# Patient Record
Sex: Female | Born: 1950
Health system: Southern US, Community
[De-identification: ages and names within clinical notes are randomized; demographics above are authoritative.]

## PROBLEM LIST (undated history)

## (undated) DIAGNOSIS — I10 Essential (primary) hypertension: Secondary | ICD-10-CM

## (undated) DIAGNOSIS — I6529 Occlusion and stenosis of unspecified carotid artery: Secondary | ICD-10-CM

## (undated) DIAGNOSIS — R112 Nausea with vomiting, unspecified: Secondary | ICD-10-CM

## (undated) DIAGNOSIS — Z9889 Other specified postprocedural states: Secondary | ICD-10-CM

## (undated) DIAGNOSIS — M1990 Unspecified osteoarthritis, unspecified site: Secondary | ICD-10-CM

## (undated) HISTORY — PX: DENTAL SURGERY: SHX609

## (undated) HISTORY — PX: EYE SURGERY: SHX253

## (undated) HISTORY — PX: CHOLECYSTECTOMY: SHX55

## (undated) HISTORY — PX: DILATION AND CURETTAGE OF UTERUS: SHX78

## (undated) HISTORY — PX: OTHER SURGICAL HISTORY: SHX169

## (undated) HISTORY — DX: Occlusion and stenosis of unspecified carotid artery: I65.29

---

## 1999-09-29 ENCOUNTER — Encounter: Payer: Self-pay | Admitting: *Deleted

## 1999-09-29 ENCOUNTER — Encounter: Admission: RE | Admit: 1999-09-29 | Discharge: 1999-09-29 | Payer: Self-pay | Admitting: *Deleted

## 1999-10-06 ENCOUNTER — Encounter: Payer: Self-pay | Admitting: *Deleted

## 1999-10-06 ENCOUNTER — Encounter: Admission: RE | Admit: 1999-10-06 | Discharge: 1999-10-06 | Payer: Self-pay | Admitting: *Deleted

## 2000-10-31 ENCOUNTER — Encounter: Payer: Self-pay | Admitting: *Deleted

## 2000-10-31 ENCOUNTER — Encounter: Admission: RE | Admit: 2000-10-31 | Discharge: 2000-10-31 | Payer: Self-pay | Admitting: Family Medicine

## 2001-12-26 ENCOUNTER — Encounter: Admission: RE | Admit: 2001-12-26 | Discharge: 2001-12-26 | Payer: Self-pay | Admitting: Family Medicine

## 2001-12-26 ENCOUNTER — Encounter: Payer: Self-pay | Admitting: *Deleted

## 2003-06-15 ENCOUNTER — Encounter: Payer: Self-pay | Admitting: *Deleted

## 2003-06-15 ENCOUNTER — Encounter: Admission: RE | Admit: 2003-06-15 | Discharge: 2003-06-15 | Payer: Self-pay | Admitting: Family Medicine

## 2004-08-10 ENCOUNTER — Encounter: Admission: RE | Admit: 2004-08-10 | Discharge: 2004-08-10 | Payer: Self-pay | Admitting: Family Medicine

## 2004-08-19 ENCOUNTER — Encounter: Admission: RE | Admit: 2004-08-19 | Discharge: 2004-08-19 | Payer: Self-pay | Admitting: Family Medicine

## 2005-10-17 ENCOUNTER — Encounter: Admission: RE | Admit: 2005-10-17 | Discharge: 2005-10-17 | Payer: Self-pay | Admitting: Family Medicine

## 2007-01-22 ENCOUNTER — Encounter: Admission: RE | Admit: 2007-01-22 | Discharge: 2007-01-22 | Payer: Self-pay | Admitting: Family Medicine

## 2010-10-01 ENCOUNTER — Encounter: Payer: Self-pay | Admitting: Family Medicine

## 2011-08-10 ENCOUNTER — Encounter (HOSPITAL_COMMUNITY): Payer: Self-pay

## 2011-08-14 ENCOUNTER — Encounter (HOSPITAL_COMMUNITY)
Admission: RE | Admit: 2011-08-14 | Discharge: 2011-08-14 | Disposition: A | Discharge status: Home / Self Care | Payer: Managed Care, Other (non HMO) | Source: Ambulatory Visit | Attending: Orthopedic Surgery | Admitting: Orthopedic Surgery

## 2011-08-14 ENCOUNTER — Encounter (HOSPITAL_COMMUNITY): Payer: Self-pay

## 2011-08-14 HISTORY — DX: Essential (primary) hypertension: I10

## 2011-08-14 HISTORY — DX: Nausea with vomiting, unspecified: R11.2

## 2011-08-14 HISTORY — DX: Other specified postprocedural states: Z98.890

## 2011-08-14 HISTORY — DX: Unspecified osteoarthritis, unspecified site: M19.90

## 2011-08-14 LAB — URINALYSIS, ROUTINE W REFLEX MICROSCOPIC
Leukocytes, UA: NEGATIVE
Protein, ur: NEGATIVE mg/dL
Urobilinogen, UA: 0.2 mg/dL (ref 0.0–1.0)

## 2011-08-14 LAB — APTT: aPTT: 31 seconds (ref 24–37)

## 2011-08-14 LAB — TYPE AND SCREEN: ABO/RH(D): O POS

## 2011-08-14 LAB — SURGICAL PCR SCREEN: MRSA, PCR: NEGATIVE

## 2011-08-14 LAB — PROTIME-INR: Prothrombin Time: 13.2 seconds (ref 11.6–15.2)

## 2011-08-14 MED ORDER — TRANEXAMIC ACID 100 MG/ML IV SOLN
1530.0000 mg | Freq: Once | INTRAVENOUS | Status: AC
  Administered 2011-08-15: 1530 mg via INTRAVENOUS
  Filled 2011-08-14: qty 15.3

## 2011-08-14 NOTE — H&P (Signed)
  Kristin Byrd is an 60 y.o. female.    Chief Complaint: right hip OA and pain   HPI: Pt is a 60 y.o. female complaining of right hip pain for a couple of years. Pain had continually increased since the beginning. X-rays in the clinic show end-stage arthritic changes of the right hip. Pt has tried various conservative treatments which have failed to alleviate their symptoms. Various options are discussed with the patient. Risks, benefits and expectations were discussed with the patient. Patient understand the risks, benefits and expectations and wishes to proceed with surgery.   PCP:  Hadley Pen, MD  D/C Plans: Home with HHPT  Post-op Meds: Rx given for ASA, Robaxin, Celebrex, Iron, Colace and MiraLax  Tranexamic Acid: To be given  PMH: Diabetes - non-insulin HTN Seasonal Allergies  PSH: Cholecystectomy Excision of wrist cyst  Social History:  does not have a smoking history on file. She does not have any smokeless tobacco history on file. Her alcohol and drug histories not on file.  Allergies:  Allergies  Allergen Reactions  . Hydrocodone Itching  . Oxycodone Itching  . Shellfish Allergy Nausea And Vomiting    Medications: 1. Metformin 500 mg 1 PO bid 2. Lotrel 5/20 mg 1 PO bid 3. Montelukast 10 mg 1 PO daily 4. Zyrtec 10 mg 1 PO daily   ROS: Review of Systems  Constitutional: Negative.   HENT: Negative.   Respiratory: Negative.   Cardiovascular: Negative.   Gastrointestinal: Negative.   Genitourinary: Negative.   Musculoskeletal: Positive for joint pain.  Skin: Negative.   Neurological: Negative.   Endo/Heme/Allergies: Positive for environmental allergies.  Psychiatric/Behavioral: Negative.      Physican Exam: Physical Exam  Constitutional: She is oriented to person, place, and time and well-developed, well-nourished, and in no distress. No distress.  HENT:  Head: Normocephalic and atraumatic.  Eyes: Conjunctivae and EOM are normal. Pupils  are equal, round, and reactive to light.  Neck: Neck supple. No JVD present. No tracheal deviation present. No thyromegaly present.  Cardiovascular: Normal rate, regular rhythm and normal heart sounds.  Exam reveals no gallop and no friction rub.   No murmur heard. Pulmonary/Chest: Effort normal and breath sounds normal. No stridor. No respiratory distress. She has no wheezes. She has no rales. She exhibits no tenderness.  Abdominal: Soft. There is no tenderness. There is no guarding.  Musculoskeletal:       Right knee: She exhibits decreased range of motion and swelling. She exhibits no ecchymosis and no erythema. tenderness found.  Lymphadenopathy:    She has no cervical adenopathy.  Neurological: She is alert and oriented to person, place, and time.  Skin: Skin is warm and dry. No rash noted. She is not diaphoretic. No erythema. No pallor.  Psychiatric: Affect normal.    Assessment/Plan Assessment: right Hip OA and pain   Plan: Patient will undergo a right total hip arthroplasty, anterior approach on 08/15/2011. Risks benefits and expectation were discussed with the patient. Patient understand risks, benefits and expectation and wishes to proceed.   Anastasio Auerbach Kristin Byrd   PAC  08/14/2011, 10:14 AM

## 2011-08-14 NOTE — Patient Instructions (Signed)
[name]  08/14/2011   Your procedure is scheduled on:  08/15/11  Report to [facility] at 12:00 PM  Call this number if you have problems the morning of surgery: [phone]   Remember:   Do not eat food:AFTER MIDNIGHT  May have clear liquids:UNTIL 8:00AM  Clear liquids include soda, tea, black coffee, apple or grape juice, broth.  Take these medicines the morning of surgery with A SIP OF WATER: SINGULAIR / NORCO OR PERCOCET IF NEEDED MAY USE NASONEX   Do not wear jewelry, make-up or nail polish.  Do not wear lotions, powders, or perfumes. You may wear deodorant.  Do not shave 48 hours prior to surgery.  Do not bring valuables to the hospital.  Contacts, dentures or bridgework may not be worn into surgery.  Leave suitcase in the car. After surgery it may be brought to your room.  For patients admitted to the hospital, checkout time is 11:00 AM the day of discharge.   Patients discharged the day of surgery will not be allowed to drive home.  Name and phone number of your driver:   Special Instructions: CHG Shower Use Special Wash: 1/2 bottle night before surgery and 1/2 bottle morning of surgery.   Please read over the following fact sheets that you were given: MRSA Information

## 2011-08-15 ENCOUNTER — Encounter (HOSPITAL_COMMUNITY): Payer: Self-pay | Admitting: *Deleted

## 2011-08-15 ENCOUNTER — Inpatient Hospital Stay (HOSPITAL_COMMUNITY): Payer: Managed Care, Other (non HMO)

## 2011-08-15 ENCOUNTER — Encounter (HOSPITAL_COMMUNITY): Payer: Self-pay | Admitting: Anesthesiology

## 2011-08-15 ENCOUNTER — Inpatient Hospital Stay (HOSPITAL_COMMUNITY)
Admission: RE | Admit: 2011-08-15 | Discharge: 2011-08-17 | DRG: 470 | Disposition: A | Discharge status: Home Health | Payer: Managed Care, Other (non HMO) | Source: Ambulatory Visit | Attending: Orthopedic Surgery | Admitting: Orthopedic Surgery

## 2011-08-15 ENCOUNTER — Encounter (HOSPITAL_COMMUNITY)
Admission: RE | Disposition: A | Discharge status: Home Health | Payer: Self-pay | Source: Ambulatory Visit | Attending: Orthopedic Surgery

## 2011-08-15 ENCOUNTER — Inpatient Hospital Stay (HOSPITAL_COMMUNITY): Payer: Managed Care, Other (non HMO) | Admitting: Anesthesiology

## 2011-08-15 DIAGNOSIS — E119 Type 2 diabetes mellitus without complications: Secondary | ICD-10-CM | POA: Diagnosis present

## 2011-08-15 DIAGNOSIS — Z96649 Presence of unspecified artificial hip joint: Secondary | ICD-10-CM

## 2011-08-15 DIAGNOSIS — Z01812 Encounter for preprocedural laboratory examination: Secondary | ICD-10-CM

## 2011-08-15 DIAGNOSIS — I10 Essential (primary) hypertension: Secondary | ICD-10-CM | POA: Diagnosis present

## 2011-08-15 DIAGNOSIS — M169 Osteoarthritis of hip, unspecified: Principal | ICD-10-CM | POA: Diagnosis present

## 2011-08-15 DIAGNOSIS — M1610 Unilateral primary osteoarthritis, unspecified hip: Principal | ICD-10-CM | POA: Diagnosis present

## 2011-08-15 HISTORY — PX: TOTAL HIP ARTHROPLASTY: SHX124

## 2011-08-15 LAB — GLUCOSE, CAPILLARY
Glucose-Capillary: 95 mg/dL (ref 70–99)
Glucose-Capillary: 145 mg/dL — ABNORMAL HIGH (ref 70–99)
Glucose-Capillary: 190 mg/dL — ABNORMAL HIGH (ref 70–99)

## 2011-08-15 SURGERY — ARTHROPLASTY, HIP, TOTAL, ANTERIOR APPROACH
Anesthesia: General | Site: Hip | Laterality: Right | Wound class: Clean

## 2011-08-15 MED ORDER — VITAMIN D 50 MCG (2000 UT) PO TABS: 2000.0000 [IU] | ORAL_TABLET | ORAL | Status: DC

## 2011-08-15 MED ORDER — OMEGA-3-ACID ETHYL ESTERS 1 G PO CAPS
1.0000 g | ORAL_CAPSULE | Freq: Two times a day (BID) | ORAL | Status: DC
  Administered 2011-08-16 (×2): 1 g via ORAL
  Filled 2011-08-15 (×6): qty 1

## 2011-08-15 MED ORDER — GLYCOPYRROLATE 0.2 MG/ML IJ SOLN
INTRAMUSCULAR | Status: DC | PRN
  Administered 2011-08-15: .4 mg via INTRAVENOUS

## 2011-08-15 MED ORDER — METOCLOPRAMIDE HCL 5 MG/ML IJ SOLN: 5.0000 mg | Freq: Three times a day (TID) | INTRAMUSCULAR | Status: DC | PRN

## 2011-08-15 MED ORDER — FLUTICASONE PROPIONATE 50 MCG/ACT NA SUSP
1.0000 | Freq: Every day | NASAL | Status: DC
  Administered 2011-08-16: 1 via NASAL
  Filled 2011-08-15: qty 16

## 2011-08-15 MED ORDER — CEFAZOLIN SODIUM-DEXTROSE 2-3 GM-% IV SOLR
INTRAVENOUS | Status: AC
  Filled 2011-08-15: qty 50

## 2011-08-15 MED ORDER — PHENOL 1.4 % MT LIQD
1.0000 | OROMUCOSAL | Status: DC | PRN
  Filled 2011-08-15: qty 177

## 2011-08-15 MED ORDER — ALUM & MAG HYDROXIDE-SIMETH 200-200-20 MG/5ML PO SUSP: 30.0000 mL | ORAL | Status: DC | PRN

## 2011-08-15 MED ORDER — AMLODIPINE BESY-BENAZEPRIL HCL 5-20 MG PO CAPS: 1.0000 | ORAL_CAPSULE | Freq: Two times a day (BID) | ORAL | Status: DC

## 2011-08-15 MED ORDER — HYDROMORPHONE HCL PF 1 MG/ML IJ SOLN: 0.5000 mg | INTRAMUSCULAR | Status: DC | PRN

## 2011-08-15 MED ORDER — ROCURONIUM BROMIDE 100 MG/10ML IV SOLN
INTRAVENOUS | Status: DC | PRN
  Administered 2011-08-15: 50 mg via INTRAVENOUS
  Administered 2011-08-15: 20 mg via INTRAVENOUS

## 2011-08-15 MED ORDER — ESMOLOL HCL 10 MG/ML IV SOLN
INTRAVENOUS | Status: DC | PRN
  Administered 2011-08-15: 20 mg via INTRAVENOUS

## 2011-08-15 MED ORDER — SODIUM CHLORIDE 0.9 % IR SOLN
Status: DC | PRN
  Administered 2011-08-15: 1000 mL

## 2011-08-15 MED ORDER — POLYETHYLENE GLYCOL 3350 17 G PO PACK
17.0000 g | PACK | Freq: Two times a day (BID) | ORAL | Status: DC
  Administered 2011-08-16: 17 g via ORAL
  Filled 2011-08-15 (×6): qty 1

## 2011-08-15 MED ORDER — AMLODIPINE BESYLATE 5 MG PO TABS
5.0000 mg | ORAL_TABLET | Freq: Two times a day (BID) | ORAL | Status: DC
  Administered 2011-08-15 – 2011-08-17 (×4): 5 mg via ORAL
  Filled 2011-08-15 (×6): qty 1

## 2011-08-15 MED ORDER — DEXAMETHASONE SODIUM PHOSPHATE 10 MG/ML IJ SOLN
INTRAMUSCULAR | Status: DC | PRN
  Administered 2011-08-15: 10 mg via INTRAVENOUS

## 2011-08-15 MED ORDER — CARBOXYMETHYLCELLULOSE SODIUM 1 % OP SOLN: 1.0000 [drp] | Freq: Every day | OPHTHALMIC | Status: DC

## 2011-08-15 MED ORDER — MIDAZOLAM HCL 5 MG/5ML IJ SOLN
INTRAMUSCULAR | Status: DC | PRN
  Administered 2011-08-15: 2 mg via INTRAVENOUS

## 2011-08-15 MED ORDER — MONTELUKAST SODIUM 10 MG PO TABS
10.0000 mg | ORAL_TABLET | ORAL | Status: DC
Start: 2011-08-16 — End: 2011-08-17
  Administered 2011-08-16: 10 mg via ORAL
  Filled 2011-08-15 (×3): qty 1

## 2011-08-15 MED ORDER — HYDROCODONE-ACETAMINOPHEN 7.5-325 MG PO TABS
1.0000 | ORAL_TABLET | ORAL | Status: DC | PRN
  Administered 2011-08-15: 1 via ORAL
  Administered 2011-08-16: 2 via ORAL
  Administered 2011-08-16 (×2): 1 via ORAL
  Administered 2011-08-16 – 2011-08-17 (×4): 2 via ORAL
  Filled 2011-08-15 (×3): qty 2
  Filled 2011-08-15 (×2): qty 1
  Filled 2011-08-15: qty 2
  Filled 2011-08-15 (×2): qty 1
  Filled 2011-08-15: qty 2
  Filled 2011-08-15: qty 1

## 2011-08-15 MED ORDER — ACETAMINOPHEN 10 MG/ML IV SOLN
INTRAVENOUS | Status: DC | PRN
  Administered 2011-08-15: 1000 mg via INTRAVENOUS

## 2011-08-15 MED ORDER — METOCLOPRAMIDE HCL 10 MG PO TABS
5.0000 mg | ORAL_TABLET | Freq: Three times a day (TID) | ORAL | Status: DC | PRN
  Filled 2011-08-15: qty 1

## 2011-08-15 MED ORDER — INSULIN ASPART 100 UNIT/ML ~~LOC~~ SOLN
0.0000 [IU] | Freq: Three times a day (TID) | SUBCUTANEOUS | Status: DC
  Administered 2011-08-16: 2 [IU] via SUBCUTANEOUS
  Administered 2011-08-16: 3 [IU] via SUBCUTANEOUS
  Administered 2011-08-16: 2 [IU] via SUBCUTANEOUS
  Filled 2011-08-15 (×2): qty 3

## 2011-08-15 MED ORDER — ONDANSETRON HCL 4 MG PO TABS: 4.0000 mg | ORAL_TABLET | Freq: Four times a day (QID) | ORAL | Status: DC | PRN

## 2011-08-15 MED ORDER — BENAZEPRIL HCL 20 MG PO TABS
20.0000 mg | ORAL_TABLET | Freq: Two times a day (BID) | ORAL | Status: DC
  Administered 2011-08-15 – 2011-08-17 (×4): 20 mg via ORAL
  Filled 2011-08-15 (×6): qty 1

## 2011-08-15 MED ORDER — LIDOCAINE HCL (CARDIAC) 20 MG/ML IV SOLN
INTRAVENOUS | Status: DC | PRN
  Administered 2011-08-15: 60 mg via INTRAVENOUS

## 2011-08-15 MED ORDER — VITAMIN D3 25 MCG (1000 UNIT) PO TABS
2000.0000 [IU] | ORAL_TABLET | Freq: Every day | ORAL | Status: DC
  Administered 2011-08-16: 2000 [IU] via ORAL
  Filled 2011-08-15 (×3): qty 2

## 2011-08-15 MED ORDER — POLYVINYL ALCOHOL 1.4 % OP SOLN
1.0000 [drp] | Freq: Every day | OPHTHALMIC | Status: DC
  Filled 2011-08-15: qty 15

## 2011-08-15 MED ORDER — LACTATED RINGERS IV SOLN
INTRAVENOUS | Status: DC
  Administered 2011-08-15: 1000 mL via INTRAVENOUS
  Administered 2011-08-15: 19:00:00 via INTRAVENOUS

## 2011-08-15 MED ORDER — ACETAMINOPHEN 650 MG RE SUPP: 650.0000 mg | Freq: Four times a day (QID) | RECTAL | Status: DC | PRN

## 2011-08-15 MED ORDER — NEOSTIGMINE METHYLSULFATE 1 MG/ML IJ SOLN
INTRAMUSCULAR | Status: DC | PRN
  Administered 2011-08-15: 3 mg via INTRAVENOUS

## 2011-08-15 MED ORDER — FENTANYL CITRATE 0.05 MG/ML IJ SOLN
INTRAMUSCULAR | Status: DC | PRN
  Administered 2011-08-15: 100 ug via INTRAVENOUS
  Administered 2011-08-15 (×3): 50 ug via INTRAVENOUS
  Administered 2011-08-15 (×2): 100 ug via INTRAVENOUS
  Administered 2011-08-15: 50 ug via INTRAVENOUS

## 2011-08-15 MED ORDER — HYDROMORPHONE HCL PF 1 MG/ML IJ SOLN
0.5000 mg | INTRAMUSCULAR | Status: DC | PRN
  Administered 2011-08-15: 0.5 mg via INTRAVENOUS

## 2011-08-15 MED ORDER — CEFAZOLIN SODIUM-DEXTROSE 2-3 GM-% IV SOLR
2.0000 g | Freq: Once | INTRAVENOUS | Status: AC
  Administered 2011-08-15: 2 g via INTRAVENOUS

## 2011-08-15 MED ORDER — RIVAROXABAN 10 MG PO TABS
10.0000 mg | ORAL_TABLET | Freq: Every day | ORAL | Status: DC
  Administered 2011-08-16 – 2011-08-17 (×2): 10 mg via ORAL
  Filled 2011-08-15 (×3): qty 1

## 2011-08-15 MED ORDER — HYDROMORPHONE HCL PF 1 MG/ML IJ SOLN
0.2500 mg | INTRAMUSCULAR | Status: DC | PRN
  Administered 2011-08-15 (×3): 0.5 mg via INTRAVENOUS

## 2011-08-15 MED ORDER — ONDANSETRON HCL 4 MG/2ML IJ SOLN
4.0000 mg | Freq: Four times a day (QID) | INTRAMUSCULAR | Status: DC | PRN
  Administered 2011-08-15 – 2011-08-16 (×2): 4 mg via INTRAVENOUS
  Filled 2011-08-15 (×2): qty 2

## 2011-08-15 MED ORDER — LORATADINE 10 MG PO TABS
10.0000 mg | ORAL_TABLET | Freq: Every day | ORAL | Status: DC
  Administered 2011-08-16: 10 mg via ORAL
  Filled 2011-08-15 (×3): qty 1

## 2011-08-15 MED ORDER — ONDANSETRON HCL 4 MG/2ML IJ SOLN
INTRAMUSCULAR | Status: DC | PRN
  Administered 2011-08-15: 4 mg via INTRAVENOUS

## 2011-08-15 MED ORDER — DOCUSATE SODIUM 100 MG PO CAPS
100.0000 mg | ORAL_CAPSULE | Freq: Two times a day (BID) | ORAL | Status: DC
  Administered 2011-08-16 (×3): 100 mg via ORAL
  Filled 2011-08-15 (×6): qty 1

## 2011-08-15 MED ORDER — LACTATED RINGERS IV SOLN
INTRAVENOUS | Status: DC | PRN
  Administered 2011-08-15 (×2): via INTRAVENOUS

## 2011-08-15 MED ORDER — METFORMIN HCL 500 MG PO TABS
500.0000 mg | ORAL_TABLET | Freq: Two times a day (BID) | ORAL | Status: DC
  Administered 2011-08-16 – 2011-08-17 (×3): 500 mg via ORAL
  Filled 2011-08-15 (×5): qty 1

## 2011-08-15 MED ORDER — ACETAMINOPHEN 10 MG/ML IV SOLN
INTRAVENOUS | Status: AC
  Filled 2011-08-15: qty 100

## 2011-08-15 MED ORDER — PROPOFOL 10 MG/ML IV EMUL
INTRAVENOUS | Status: DC | PRN
  Administered 2011-08-15: 200 mg via INTRAVENOUS

## 2011-08-15 MED ORDER — PROMETHAZINE HCL 25 MG/ML IJ SOLN: 6.2500 mg | INTRAMUSCULAR | Status: DC | PRN

## 2011-08-15 MED ORDER — BISACODYL 5 MG PO TBEC: 5.0000 mg | DELAYED_RELEASE_TABLET | Freq: Every day | ORAL | Status: DC | PRN

## 2011-08-15 MED ORDER — METHOCARBAMOL 500 MG PO TABS
500.0000 mg | ORAL_TABLET | Freq: Four times a day (QID) | ORAL | Status: DC | PRN
  Administered 2011-08-16 – 2011-08-17 (×5): 500 mg via ORAL
  Filled 2011-08-15 (×5): qty 1

## 2011-08-15 MED ORDER — SODIUM CHLORIDE 0.9 % IV SOLN
INTRAVENOUS | Status: DC
  Administered 2011-08-15 – 2011-08-16 (×3): via INTRAVENOUS
  Filled 2011-08-15 (×8): qty 1000

## 2011-08-15 MED ORDER — ACETAMINOPHEN 325 MG PO TABS: 650.0000 mg | ORAL_TABLET | Freq: Four times a day (QID) | ORAL | Status: DC | PRN

## 2011-08-15 MED ORDER — FERROUS SULFATE 325 (65 FE) MG PO TABS
325.0000 mg | ORAL_TABLET | Freq: Three times a day (TID) | ORAL | Status: DC
  Administered 2011-08-16 – 2011-08-17 (×4): 325 mg via ORAL
  Filled 2011-08-15 (×7): qty 1

## 2011-08-15 MED ORDER — HYPROMELLOSE (GONIOSCOPIC) 2.5 % OP SOLN
1.0000 [drp] | Freq: Every day | OPHTHALMIC | Status: DC
  Filled 2011-08-15: qty 15

## 2011-08-15 MED ORDER — MENTHOL 3 MG MT LOZG
1.0000 | LOZENGE | OROMUCOSAL | Status: DC | PRN
  Filled 2011-08-15: qty 9

## 2011-08-15 MED ORDER — METHOCARBAMOL 100 MG/ML IJ SOLN
500.0000 mg | Freq: Four times a day (QID) | INTRAVENOUS | Status: DC | PRN
  Administered 2011-08-15: 500 mg via INTRAVENOUS
  Filled 2011-08-15: qty 5

## 2011-08-15 MED ORDER — CEFAZOLIN SODIUM 1-5 GM-% IV SOLN
1.0000 g | Freq: Four times a day (QID) | INTRAVENOUS | Status: AC
  Administered 2011-08-15 – 2011-08-16 (×3): 1 g via INTRAVENOUS
  Filled 2011-08-15 (×2): qty 50

## 2011-08-15 SURGICAL SUPPLY — 41 items
ADH SKN CLS APL DERMABOND .7 (GAUZE/BANDAGES/DRESSINGS) ×1
BAG SPEC THK2 15X12 ZIP CLS (MISCELLANEOUS) ×2
BAG ZIPLOCK 12X15 (MISCELLANEOUS) ×4 IMPLANT
BLADE SAW SGTL 18X1.27X75 (BLADE) ×2 IMPLANT
CELLS DAT CNTRL 66122 CELL SVR (MISCELLANEOUS) ×1 IMPLANT
CLOTH BEACON ORANGE TIMEOUT ST (SAFETY) ×2 IMPLANT
DERMABOND ADVANCED (GAUZE/BANDAGES/DRESSINGS) ×1
DERMABOND ADVANCED .7 DNX12 (GAUZE/BANDAGES/DRESSINGS) ×1 IMPLANT
DRAPE C-ARM 42X72 X-RAY (DRAPES) ×2 IMPLANT
DRAPE STERI IOBAN 125X83 (DRAPES) ×2 IMPLANT
DRAPE U-SHAPE 47X51 STRL (DRAPES) ×6 IMPLANT
DRSG AQUACEL AG ADV 3.5X10 (GAUZE/BANDAGES/DRESSINGS) ×2 IMPLANT
DRSG TEGADERM 4X4.75 (GAUZE/BANDAGES/DRESSINGS) ×2 IMPLANT
DURAPREP 26ML APPLICATOR (WOUND CARE) ×2 IMPLANT
ELECT BLADE TIP CTD 4 INCH (ELECTRODE) ×2 IMPLANT
ELECT REM PT RETURN 9FT ADLT (ELECTROSURGICAL) ×2
ELECTRODE REM PT RTRN 9FT ADLT (ELECTROSURGICAL) ×1 IMPLANT
EVACUATOR 1/8 PVC DRAIN (DRAIN) IMPLANT
FACESHIELD LNG OPTICON STERILE (SAFETY) ×8 IMPLANT
GAUZE SPONGE 2X2 8PLY STRL LF (GAUZE/BANDAGES/DRESSINGS) ×1 IMPLANT
GLOVE BIOGEL PI IND STRL 7.5 (GLOVE) ×1 IMPLANT
GLOVE BIOGEL PI IND STRL 8 (GLOVE) ×1 IMPLANT
GLOVE BIOGEL PI INDICATOR 7.5 (GLOVE) ×1
GLOVE BIOGEL PI INDICATOR 8 (GLOVE) ×1
GLOVE ECLIPSE 8.0 STRL XLNG CF (GLOVE) ×2 IMPLANT
GLOVE ORTHO TXT STRL SZ7.5 (GLOVE) ×4 IMPLANT
GOWN PREVENTION PLUS XLARGE (GOWN DISPOSABLE) ×2 IMPLANT
GOWN PREVENTION PLUS XXLARGE (GOWN DISPOSABLE) ×4 IMPLANT
GOWN STRL NON-REIN LRG LVL3 (GOWN DISPOSABLE) ×2 IMPLANT
KIT BASIN OR (CUSTOM PROCEDURE TRAY) ×2 IMPLANT
PACK TOTAL JOINT (CUSTOM PROCEDURE TRAY) ×2 IMPLANT
PADDING CAST COTTON 6X4 STRL (CAST SUPPLIES) ×2 IMPLANT
RTRCTR WOUND ALEXIS 18CM MED (MISCELLANEOUS) ×2
SPONGE GAUZE 2X2 STER 10/PKG (GAUZE/BANDAGES/DRESSINGS) ×1
SUCTION FRAZIER 12FR DISP (SUCTIONS) ×2 IMPLANT
SUT MNCRL AB 4-0 PS2 18 (SUTURE) ×2 IMPLANT
SUT VIC AB 1 CT1 36 (SUTURE) ×8 IMPLANT
SUT VIC AB 2-0 CT1 27 (SUTURE) ×4
SUT VIC AB 2-0 CT1 TAPERPNT 27 (SUTURE) ×2 IMPLANT
TOWEL OR 17X26 10 PK STRL BLUE (TOWEL DISPOSABLE) ×4 IMPLANT
TRAY FOLEY CATH 14FRSI W/METER (CATHETERS) ×2 IMPLANT

## 2011-08-15 NOTE — Interval H&P Note (Signed)
History and Physical Interval Note:  08/15/2011 1:45 PM  Kristin Byrd  has presented today for surgery, with the diagnosis of Osteoarthritis of the Right Hip  The various methods of treatment have been discussed with the patient and family. After consideration of risks, benefits and other options for treatment, the patient has consented to  Procedure(s): RIGHT TOTAL HIP ARTHROPLASTY ANTERIOR APPROACH as a surgical intervention .  The patients' history has been reviewed, patient examined, no change in status, stable for surgery.  I have reviewed the patients' chart and labs.  Questions were answered to the patient's satisfaction.     Shelda Pal

## 2011-08-15 NOTE — Op Note (Signed)
NAME:  Kristin Byrd                ACCOUNT NO.: [id number]      MEDICAL RECORD NO.: [id number]      FACILITY:  [facility]      PHYSICIAN:  Durene Romans D  DATE OF BIRTH:  1951/05/22     DATE OF PROCEDURE:  08/15/2011                                 OPERATIVE REPORT         PREOPERATIVE DIAGNOSIS: Right  hip osteoarthritis.      POSTOPERATIVE DIAGNOSIS:  Right osteoarthritis.      PROCEDURE:  Right total hip replacement through an anterior approach   utilizing DePuy THR system, component size 54 pinnacle cup, a size 36+4 neutral   Altrex liner, a size 7Hi Tri Lock stem with a 36+1.5 delta ceramic   ball.      SURGEON:  Madlyn Frankel. Charlann Boxer, M.D.      ASSISTANT:  Lanney Gins, PA      ANESTHESIA:  General.      SPECIMENS:  None.      COMPLICATIONS:  None.      BLOOD LOSS:  300 cc     DRAINS:  One Hemovac.      INDICATION OF THE PROCEDURE:  Kristin Byrd is a 60 y.o. female who had   presented to office for evaluation of right hip pain.  Radiographs revealed   progressive degenerative changes with bone-on-bone   articulation to the  hip joint.  The patient had painful limited range of   motion significantly affecting their overall quality of life.  The patient was failing to    respond to conservative measures, and at this point was ready   to proceed with more definitive measures.  The patient has noted progressive   degenerative changes in his hip, progressive problems and dysfunction   with regarding the hip prior to surgery.  Consent was obtained for   benefit of pain relief.  Specific risk of infection, DVT, component   failure, dislocation, need for revision surgery, as well discussion of   the anterior versus posterior approach were reviewed.  Consent was   obtained for benefit of anterior pain relief through an anterior   approach.      PROCEDURE IN DETAIL:  The patient was brought to operative theater.   Once adequate anesthesia, preoperative  antibiotics, 2gm Ancef administered.   The patient was positioned supine on the OSI Hanna table.  Once adequate   padding of boney process was carried out, we had predraped out the hip, and  used fluoroscopy to confirm orientation of the pelvis and position.      The right hip was then prepped and draped from proximal iliac crest to   mid thigh with shower curtain technique.      Time-out was performed identifying the patient, planned procedure, and   extremity.     An incision was then made 2 cm distal and lateral to the   anterior superior iliac spine extending over the orientation of the   tensor fascia lata muscle and sharp dissection was carried down to the   fascia of the muscle and protractor placed in the soft tissues.      The fascia was then incised.  The muscle belly was identified and swept   laterally  and retractor placed along the superior neck.  Following   cauterization of the circumflex vessels and removing some pericapsular   fat, a second cobra retractor was placed on the inferior neck.  A third   retractor was placed on the anterior acetabulum after elevating the   anterior rectus.  A L-capsulotomy was along the line of the   superior neck to the trochanteric fossa, then extended proximally and   distally.  Tag sutures were placed and the retractors were then placed   intracapsular.  We then identified the trochanteric fossa and   orientation of my neck cut, confirmed this radiographically   and then made a neck osteotomy with the femur on traction.  The femoral   head was removed without difficulty or complication.  Traction was let   off and retractors were placed posterior and anterior around the   acetabulum.      The labrum and foveal tissue were debrided.  I began reaming with a 46   reamer and reamed up to 53 reamer with good bony bed preparation and a 54   cup was chosen.  The final 54 Pinnacle cup was then impacted under fluoroscopy  to confirm the depth  of penetration and orientation with respect to   abduction.  A screw was placed followed by the hole eliminator.  The final   36+1.5 Altrex liner was impacted with good visualized rim fit.  The cup was positioned anatomically within the acetabular portion of the pelvis.      At this point, the femur was rolled at 80 degrees.  Further capsule was   released off the inferior aspect of the femoral neck.  I then   released the superior capsule proximally.  The hook was placed laterally   along the femur and elevated manually and held in position with the bed   hook.  The leg was then extended and adducted with the leg rolled to 100   degrees of external rotation.  Once the proximal femur was fully   exposed, I used a box osteotome to set orientation.  I then began   broaching with the starting chili pepper broach and passed this by hand and then broached up to 7.  With the 7 broach in place I chose a high offset neck and did a trial reduction.  The offset was appropriate, leg lengths   appeared to be equal, confirmed radiographically.   Given these findings, I went ahead and dislocated the hip, repositioned all   retractors and positioned the right hip in the extended and abducted position.  The final 7Hi  Tri Lock stem was   chosen and it was impacted down to the level of neck cut.  Based on this   and the trial reduction, a 36+1.5 delta ceramic ball was chosen and   impacted onto a clean and dry trunnion, and the hip was reduced.  The   hip had been irrigated throughout the case again at this point.  I did   reapproximate the superior capsular leaflet to the anterior leaflet   using #1 Vicryl, placed a medium Hemovac drain deep.  The fascia of the   tensor fascia lata muscle was then reapproximated using #1 Vicryl.  The   remaining wound was closed with 2-0 Vicryl and running 4-0 Monocryl.   The hip was cleaned, dried, and dressed sterilely using Dermabond and   Aquacel dressing.  Drain site  dressed separately.  She was then brought  to recovery room in stable condition tolerating the procedure well.    Lanney Gins, PA-C was present for the entirety of the case involved from   preoperative positioning, perioperative retractor management, general   facilitation of the case, as well as primary wound closure as assistant.            Madlyn Frankel Charlann Boxer, M.D.            MDO/MEDQ  D:  07/04/2011  T:  07/04/2011  Job:  [id number]      Electronically Signed by Durene Romans M.D. on 07/10/2011 09:15:38 AM

## 2011-08-15 NOTE — Anesthesia Postprocedure Evaluation (Signed)
  Anesthesia Post-op Note  Patient: Kristin Byrd  Procedure(s) Performed:  TOTAL HIP ARTHROPLASTY ANTERIOR APPROACH  Patient Location: PACU  Anesthesia Type: General  Level of Consciousness: awake and alert   Airway and Oxygen Therapy: Patient Spontanous Breathing  Post-op Pain: mild  Post-op Assessment: Post-op Vital signs reviewed, Patient's Cardiovascular Status Stable, Respiratory Function Stable, Patent Airway and No signs of Nausea or vomiting  Post-op Vital Signs: stable  Complications: No apparent anesthesia complications

## 2011-08-15 NOTE — Transfer of Care (Signed)
Immediate Anesthesia Transfer of Care Note  Patient: Kristin Byrd  Procedure(s) Performed:  TOTAL HIP ARTHROPLASTY ANTERIOR APPROACH  Patient Location: PACU  Anesthesia Type: General  Level of Consciousness: awake, alert , patient cooperative and responds to stimulation  Airway & Oxygen Therapy: Patient Spontanous Breathing and Patient connected to face mask oxygen  Post-op Assessment: Report given to PACU RN, Post -op Vital signs reviewed and stable and Patient moving all extremities  Post vital signs: Reviewed and stable  Complications: No apparent anesthesia complications

## 2011-08-15 NOTE — Preoperative (Signed)
Beta Blockers   Reason not to administer Beta Blockers:Not Applicable 

## 2011-08-15 NOTE — Anesthesia Preprocedure Evaluation (Addendum)
Anesthesia Evaluation  Patient identified by MRN, date of birth, ID band Patient awake    Reviewed: Allergy & Precautions, H&P , NPO status , Patient's Chart, lab work & pertinent test results  History of Anesthesia Complications (+) PONV and Family history of anesthesia reaction  Airway Mallampati: II TM Distance: >3 FB Neck ROM: Full    Dental No notable dental hx.    Pulmonary neg pulmonary ROS,  clear to auscultation  Pulmonary exam normal       Cardiovascular hypertension, neg cardio ROS Regular Normal    Neuro/Psych Negative Neurological ROS  Negative Psych ROS   GI/Hepatic negative GI ROS, Neg liver ROS,   Endo/Other  Negative Endocrine ROSDiabetes mellitus-  Renal/GU negative Renal ROS  Genitourinary negative   Musculoskeletal negative musculoskeletal ROS (+)   Abdominal   Peds negative pediatric ROS (+)  Hematology negative hematology ROS (+)   Anesthesia Other Findings   Reproductive/Obstetrics negative OB ROS                           Anesthesia Physical Anesthesia Plan  ASA: III  Anesthesia Plan: General   Post-op Pain Management:    Induction: Intravenous  Airway Management Planned: Oral ETT  Additional Equipment:   Intra-op Plan:   Post-operative Plan: Extubation in OR  Informed Consent: I have reviewed the patients History and Physical, chart, labs and discussed the procedure including the risks, benefits and alternatives for the proposed anesthesia with the patient or authorized representative who has indicated his/her understanding and acceptance.   Dental advisory given  Plan Discussed with: CRNA  Anesthesia Plan Comments:         Anesthesia Quick Evaluation

## 2011-08-16 DIAGNOSIS — Z96649 Presence of unspecified artificial hip joint: Secondary | ICD-10-CM

## 2011-08-16 LAB — GLUCOSE, CAPILLARY
Glucose-Capillary: 130 mg/dL — ABNORMAL HIGH (ref 70–99)
Glucose-Capillary: 134 mg/dL — ABNORMAL HIGH (ref 70–99)

## 2011-08-16 LAB — CBC
MCH: 31.5 pg (ref 26.0–34.0)
MCHC: 35 g/dL (ref 30.0–36.0)
Platelets: 250 10*3/uL (ref 150–400)
RBC: 3.3 MIL/uL — ABNORMAL LOW (ref 3.87–5.11)

## 2011-08-16 LAB — BASIC METABOLIC PANEL
CO2: 25 mEq/L (ref 19–32)
Calcium: 8.6 mg/dL (ref 8.4–10.5)
GFR calc non Af Amer: >90 mL/min (ref >90)
Potassium: 4 mEq/L (ref 3.5–5.1)
Sodium: 132 mEq/L — ABNORMAL LOW (ref 135–145)

## 2011-08-16 MED ORDER — DSS 100 MG PO CAPS: 100.0000 mg | ORAL_CAPSULE | Freq: Two times a day (BID) | ORAL | Status: AC

## 2011-08-16 MED ORDER — FERROUS SULFATE 325 (65 FE) MG PO TABS: 325.0000 mg | ORAL_TABLET | Freq: Three times a day (TID) | ORAL | Status: DC

## 2011-08-16 MED ORDER — METHOCARBAMOL 500 MG PO TABS: 500.0000 mg | ORAL_TABLET | Freq: Four times a day (QID) | ORAL | Status: AC | PRN

## 2011-08-16 MED ORDER — POLYETHYLENE GLYCOL 3350 17 G PO PACK: 17.0000 g | PACK | Freq: Two times a day (BID) | ORAL | Status: AC

## 2011-08-16 MED ORDER — CELECOXIB 200 MG PO CAPS: 200.0000 mg | ORAL_CAPSULE | Freq: Two times a day (BID) | ORAL | Status: DC

## 2011-08-16 MED ORDER — ASPIRIN EC 325 MG PO TBEC: 325.0000 mg | DELAYED_RELEASE_TABLET | Freq: Two times a day (BID) | ORAL | Status: AC

## 2011-08-16 NOTE — Progress Notes (Signed)
Subjective: 1 Day Post-Op Procedure(s) (LRB): TOTAL HIP ARTHROPLASTY ANTERIOR APPROACH (Right)   Patient reports pain as mild. No events.     Objective:   VITALS:   Filed Vitals:   08/16/11 0600  BP: 128/80  Pulse: 78  Temp: 98.1 F (36.7 C)  Resp: 16    Neurovascular intact Dorsiflexion/Plantar flexion intact Incision: dressing C/D/I No cellulitis present Compartment soft  LABS  Basename 08/16/11 0428  HGB 10.4*  HCT 29.7*  WBC 10.2  PLT 250     Basename 08/16/11 0428  NA 132*  K 4.0  BUN 7  CREATININE 0.52  GLUCOSE 158*     Basename 08/14/11 1110  LABPT --  INR 0.98     Assessment/Plan: 1 Day Post-Op Procedure(s) (LRB): TOTAL HIP ARTHROPLASTY ANTERIOR APPROACH (Right)  HV drain removed. Foley removed. Advance diet Up with therapy Possible D/C home tomorrow if does well with PT today.  Anastasio Auerbach Salbador Fiveash   PAC  08/16/2011, 9:03 AM

## 2011-08-16 NOTE — Progress Notes (Signed)
Physical Therapy Evaluation Patient Details Name: Kristin Byrd MRN: [id number] DOB: May 12, 1951 Today's Date: 08/16/2011  Eval 2 9:00-9:35  Problem List: There is no problem list on file for this patient.   Past Medical History:  Past Medical History  Diagnosis Date  . PONV (postoperative nausea and vomiting)   . Hypertension   . Arthritis   . Diabetes mellitus    Past Surgical History:  Past Surgical History  Procedure Date  . Dilation and curettage of uterus   . Cholecystectomy   . L wirst     CYST REMOVED  . Dental surgery     PT Assessment/Plan/Recommendation PT Assessment Clinical Impression Statement: Pt doing well with mobility for POD #1. Expect excellent progress. Expect pt will be ready for DC home tomorrow.  PT Recommendation/Assessment: Patient will need skilled PT in the acute care venue PT Problem List: Decreased strength;Decreased activity tolerance;Decreased mobility;Decreased knowledge of use of DME Barriers to Discharge: None PT Therapy Diagnosis : Difficulty walking PT Plan PT Frequency: 7X/week PT Treatment/Interventions: DME instruction;Gait training;Stair training;Functional mobility training;Therapeutic exercise;Therapeutic activities PT Recommendation Follow Up Recommendations: Home health PT Equipment Recommended: None recommended by PT (pt to borrow RW from friend, pt has shower seat) PT Goals  Acute Rehab PT Goals PT Goal Formulation: With patient Time For Goal Achievement: 3 days Pt will go Supine/Side to Sit: with modified independence;with HOB 0 degrees PT Goal: Supine/Side to Sit - Progress: Progressing toward goal Pt will go Sit to Stand: with modified independence PT Goal: Sit to Stand - Progress: Progressing toward goal Pt will Go Up / Down Stairs: 3-5 stairs;with min assist;with rolling walker Pt will Perform Home Exercise Program: with min assist PT Goal: Perform Home Exercise Program - Progress: Progressing toward  goal  PT Evaluation Precautions/Restrictions  Precautions Precautions: Other (comment) (none, direct anterior hip replacement) Restrictions Weight Bearing Restrictions: No RLE Weight Bearing: Weight bearing as tolerated Prior Functioning  Home Living Lives With: Spouse Receives Help From: Family Type of Home: House Home Layout: One level Home Access: Stairs to enter Entrance Stairs-Rails: None Entrance Stairs-Number of Steps: 4 Prior Function Level of Independence: Independent with basic ADLs;Independent with homemaking with ambulation;Independent with gait;Independent with transfers Able to Take Stairs?: Yes Driving: Yes 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 Arousal/Alertness: Awake/alert Overall Cognitive Status: Appears within functional limits for tasks assessed Orientation Level: Oriented X4 Sensation/Coordination Sensation Light Touch: Appears Intact Coordination Gross Motor Movements are Fluid and Coordinated: Yes Fine Motor Movements are Fluid and Coordinated: Yes Extremity Assessment RUE Assessment RUE Assessment: Within Functional Limits LUE Assessment LUE Assessment: Within Functional Limits RLE Assessment RLE Assessment: Exceptions to [facility] RLE Strength Right Hip Flexion: 2/5 Right Hip ABduction: 2/5 Right Knee Extension: 3-/5 LLE Assessment LLE Assessment: Within Functional Limits Mobility (including Balance) Bed Mobility Bed Mobility: Yes Supine to Sit: 3: Mod assist Supine to Sit Details (indicate cue type and reason): pt 60%, assist to elevate trunk & support RLE Sitting - Scoot to Edge of Bed: 6: Modified independent (Device/Increase time) Transfers Transfers: Yes Sit to Stand: 4: Min assist;From bed;With upper extremity assist Sit to Stand Details (indicate cue type and reason): VCs hand placement, assist to achieve upright position Stand to Sit: 4: Min assist;With armrests;With upper extremity  assist;To chair/3-in-1 Stand to Sit Details: VCs hand placement, assist to control descent, pt 90% Ambulation/Gait Ambulation/Gait: Yes Ambulation/Gait Assistance: 4: Min assist Ambulation/Gait Assistance Details (indicate cue type and reason): min/guard assist for balance  Ambulation Distance (Feet): 40 Feet Assistive device: Rolling walker Gait Pattern: Step-to pattern Stairs: No Wheelchair Mobility Wheelchair Mobility: No  Posture/Postural Control Posture/Postural Control: No significant limitations Balance Balance Assessed: Yes Static Sitting Balance Static Sitting - Balance Support: Feet supported;Bilateral upper extremity supported Static Sitting - Level of Assistance: 6: Modified independent (Device/Increase time) Static Sitting - Comment/# of Minutes: 5 Exercise  Total Joint Exercises Ankle Circles/Pumps: AROM;Both;10 reps;Supine Quad Sets: Strengthening;Right;5 reps;Seated;AROM Heel Slides: AAROM;Right;10 reps;Supine Hip ABduction/ADduction: Right;AAROM;10 reps;Supine Straight Leg Raises: Right;5 reps;AAROM;Supine Long Arc Quad: AROM;Right;5 reps;Seated End of Session PT - End of Session Equipment Utilized During Treatment: Gait belt Activity Tolerance: Patient limited by fatigue Patient left: in chair;with call bell in reach Nurse Communication: Mobility status for transfers;Mobility status for ambulation General Behavior During Session: [facility] for tasks performed Cognition: [facility] for tasks performed  Tamala Ser 08/16/2011, 11:18 AM Tamala Ser PT 08/16/2011  [phone]

## 2011-08-16 NOTE — Progress Notes (Signed)
Occupational Therapy Evaluation Patient Details Name: Kristin Byrd MRN: [id number] DOB: 06/02/1951 Today's Date: 08/16/2011 1050 1112 ev1  Problem List: There is no problem list on file for this patient.   Past Medical History:  Past Medical History  Diagnosis Date  . PONV (postoperative nausea and vomiting)   . Hypertension   . Arthritis   . Diabetes mellitus    Past Surgical History:  Past Surgical History  Procedure Date  . Dilation and curettage of uterus   . Cholecystectomy   . L wirst     CYST REMOVED  . Dental surgery     OT Assessment/Plan/Recommendation OT Assessment Clinical Impression Statement: This 60 year old female was admitted for an anterior direct total hip replacement.  She does not have precautions but needs some assist with ADLs and bathroom transfers secondary to soreness.  She has assist from husband and will borrow a tub seat.  She will benefit from a 3:1 commode.  No further OT is needed OT Recommendation/Assessment: Patient does not need any further OT services OT Recommendation Equipment Recommended: 3 in 1 bedside commode OT Goals    OT Evaluation Precautions/Restrictions  Precautions Precautions: Other (comment) (none, direct anterior hip replacement) Precaution Comments: none; anterior direct THA Restrictions Weight Bearing Restrictions: No RLE Weight Bearing: Weight bearing as tolerated Prior Functioning Home Living Lives With: Spouse Receives Help From: Other (Comment) Type of Home: House Home Layout: One level Home Access: Stairs to enter Entrance Stairs-Rails: None Entrance Stairs-Number of Steps: 4 Bathroom Shower/Tub: Engineer, manufacturing systems: Standard Home Adaptive Equipment: Other (comment) (will borrow a tub seat) Prior Function Level of Independence: Independent with basic ADLs;Independent with homemaking with ambulation;Independent with gait;Independent with transfers Able to Take Stairs?: Yes Driving:  Yes 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) ADL ADL Eating/Feeding: Simulated;Set up Where Assessed - Eating/Feeding: Chair Grooming: Simulated;Supervision/safety Where Assessed - Grooming: Standing at sink Upper Body Bathing: Simulated;Set up Where Assessed - Upper Body Bathing: Supported;Sitting, chair Lower Body Bathing: Simulated;Minimal assistance Where Assessed - Lower Body Bathing: Sit to stand from chair Upper Body Dressing: Simulated;Set up Where Assessed - Upper Body Dressing: Supported;Sitting, chair Lower Body Dressing: Simulated;Minimal assistance Where Assessed - Lower Body Dressing: Sit to stand from bed Toilet Transfer: Performed;Other (comment);Minimal assistance (min guard) Toilet Transfer Method: Proofreader: Comfort height toilet;Grab bars;Other (comment) (3:1 commode on other side to use rail) Toileting - Clothing Manipulation:  (min guard for sit to stand) Where Assessed - Toileting Clothing Manipulation: Standing Toileting - Hygiene: Performed;Set up Where Assessed - Toileting Hygiene: Sit on 3-in-1 or toilet Tub/Shower Transfer: Other (comment) (educated on tub readiness; will sponge bathe intially) Equipment Used: Rolling walker ADL Comments: Pt will have husband's help.  She will benefit from 3:1 commodel.  Educated on Sports administrator, if desired.   Vision/Perception  Vision - History Patient Visual Report: No change from baseline Cognition Cognition Arousal/Alertness: Awake/alert Overall Cognitive Status: Appears within functional limits for tasks assessed Orientation Level: Oriented X4 Sensation/Coordination Sensation Light Touch: Appears Intact Coordination Gross Motor Movements are Fluid and Coordinated: Yes Fine Motor Movements are Fluid and Coordinated: Yes Extremity Assessment RUE Assessment RUE Assessment: Within Functional Limits LUE Assessment LUE Assessment: Within Functional Limits Mobility    Transfers: Yes Sit to Stand: 4: Min assist Sit to Stand Details (indicate cue type and reason): min guard Stand to Sit: 4: Min assist Stand to Sit Details: to control descent  End of Session OT - End of  Session Activity Tolerance: Patient limited by pain Patient left: in chair Nurse Communication: Other (comment) (for pain meds) General Behavior During Session: [facility] for tasks performed Cognition: [facility] for tasks performed   Evonda Enge [phone] 08/16/2011, 12:00 PM

## 2011-08-16 NOTE — Discharge Summary (Signed)
Physician Discharge Summary  Patient ID: Kristin Byrd MRN: [id number] DOB/AGE: November 28, 1950 60 y.o.  Admit date: 08/15/2011 Discharge date: 08/16/2011  Procedures:  Procedure(s) (LRB): TOTAL HIP ARTHROPLASTY ANTERIOR APPROACH (Right)  Attending Physician: Shelda Pal   Admission Diagnoses: Right hip OA and pain   Discharge Diagnoses:  Principal Problem:  *S/P right total hip replacement Diabetes - non-insulin  HTN  Seasonal Allergies  HPI: Pt is a 60 y.o. female complaining of right hip pain for a couple of years. Pain had continually increased since the beginning. X-rays in the clinic show end-stage arthritic changes of the right hip. Pt has tried various conservative treatments which have failed to alleviate their symptoms. Various options are discussed with the patient. Risks, benefits and expectations were discussed with the patient. Patient understand the risks, benefits and expectations and wishes to proceed with surgery.  PCP: Hadley Pen, MD   Discharged Condition: good  Hospital Course:  Patient underwent the above stated procedure on 08/15/2011. Patient tolerated the procedure well and brought to the recovery room in good condition and subsequently to the floor.  POD #1 BP: 128/80 ; Pulse: 78 ; Temp: 98.1 F Patient reports pain as mild. No events. Pt's foley was removed, as well as the hemovac drain removed. IV was changed to a saline lock. Neurovascular intact, dorsiflexion/plantar flexion intact, incision: dressing C/D/I, no cellulitis present and compartment soft.  LABS  Basename  08/16/11 0428   HGB  10.4*   HCT  29.7*    POD #2  BP: 100/62 ; Pulse: 93 ; Temp: 98.8 F Patient reports pain as mild. No events. Ready to go home. Neurovascular intact, dorsiflexion/plantar flexion intact, incision: dressing C/D/I, no cellulitis present and compartment soft.  LABS  Basename  08/17/11 0400    HGB  9.9*    HCT  29.0*      Discharge  Exam: Extremities: Homans sign is negative, no sign of DVT, no edema, redness or tenderness in the calves or thighs and no ulcers, gangrene or trophic changes  Disposition: Home with Home Health PT  Discharge Orders    Future Orders Please Complete By Expires   Diet - low sodium heart healthy      Call MD / Call 911      Comments:   If you experience chest pain or shortness of breath, CALL 911 and be transported to the hospital emergency room.  If you develope a fever above 101 F, pus (white drainage) or increased drainage or redness at the wound, or calf pain, call your surgeon's office.   Discharge instructions      Comments:   Maintain surgical dressing for 8 days, then replace with gauze and tape. Keep the area dry and clean until follow up. Follow up in 2 weeks at [facility]. Call with any questions or concerns.     Constipation Prevention      Comments:   Drink plenty of fluids.  Prune juice may be helpful.  You may use a stool softener, such as Colace (over the counter) 100 mg twice a day.  Use MiraLax (over the counter) for constipation as needed.   Increase activity slowly as tolerated      Weight Bearing as taught in Physical Therapy      Comments:   Use a walker or crutches as instructed.   Driving restrictions      Comments:   No driving for 4 weeks   Change dressing  Comments:   Maintain surgical dressing for 8 days, the replace with 4x4 guaze and tape.   TED hose      Comments:   Use stockings (TED hose) for 2 weeks on both leg(s).  You may remove them at night for sleeping.     Current Discharge Medication List    START taking these medications   Details  aspirin EC 325 MG tablet Take 1 tablet (325 mg total) by mouth 2 (two) times daily. Qty: 60 tablet, Refills: 0    docusate sodium 100 MG CAPS Take 100 mg by mouth 2 (two) times daily.    ferrous sulfate 325 (65 FE) MG tablet Take 1 tablet (325 mg total) by mouth 3 (three) times daily after  meals.    HYDROcodone-acetaminophen (NORCO) 7.5-325 MG per tablet Take 1-2 tablets by mouth every 4 (four) hours as needed. Qty: 120 tablet, Refills: 0    meloxicam (MOBIC) 7.5 MG tablet Take 1 tablet (7.5 mg total) by mouth 2 (two) times daily. Qty: 60 tablet, Refills: 1    methocarbamol (ROBAXIN) 500 MG tablet Take 1 tablet (500 mg total) by mouth every 6 (six) hours as needed (muscle spasms).    polyethylene glycol (MIRALAX / GLYCOLAX) packet Take 17 g by mouth 2 (two) times daily. Qty: 14 each      CONTINUE these medications which have NOT CHANGED   Details  amLODipine-benazepril (LOTREL) 5-20 MG per capsule Take 1 capsule by mouth 2 (two) times daily. LOTREL DOSE IS NOW 10/40MG  TAKES 1 TAB AM/ 1 TAB PM DAILY    cetirizine (ZYRTEC) 10 MG tablet Take 10 mg by mouth daily.      metFORMIN (GLUCOPHAGE) 500 MG tablet Take 500 mg by mouth 2 (two) times daily with a meal.      mometasone (NASONEX) 50 MCG/ACT nasal spray Place 2 sprays into the nose every morning.      montelukast (SINGULAIR) 10 MG tablet Take 10 mg by mouth every morning.     carboxymethylcellulose (REFRESH) 1 % ophthalmic solution Apply 1 drop to eye daily.      Cholecalciferol (VITAMIN D) 2000 UNITS tablet Take 2,000 Units by mouth every morning.     Omega-3 Fatty Acids (FISH OIL) 1000 MG CPDR Take 2,000 mg by mouth 2 (two) times daily.        STOP taking these medications     HYDROcodone-acetaminophen (NORCO) 5-325 MG per tablet Comments:  Reason for Stopping:       naproxen sodium (ANAPROX) 220 MG tablet Comments:  Reason for Stopping:       oxyCODONE-acetaminophen (PERCOCET) 5-325 MG per tablet Comments:  Reason for Stopping:          Signed: Anastasio Auerbach. Vivi Piccirilli   PAC  08/17/2011, 7:46 AM

## 2011-08-16 NOTE — Progress Notes (Signed)
Physical Therapy Treatment Patient Details Name: Kristin Byrd MRN: [id number] DOB: [birth year] Today's Date: 08/16/2011  G 14:10-14:25  PT Assessment/Plan  PT - Assessment/Plan Comments on Treatment Session: Progressing well, increased gait distance, decreased assist for mobility. Will do stair training tomorrow morning.  PT Plan: Discharge plan remains appropriate PT Frequency: 7X/week Follow Up Recommendations: Home health PT Equipment Recommended: 3 in 1 bedside comode PT Goals  Acute Rehab PT Goals PT Goal Formulation: With patient Time For Goal Achievement: 3 days Pt will go Supine/Side to Sit: with modified independence;with HOB 0 degrees PT Goal: Supine/Side to Sit - Progress: Progressing toward goal Pt will go Sit to Stand: with modified independence PT Goal: Sit to Stand - Progress: Progressing toward goal Pt will Ambulate: >150 feet;with modified independence;with rolling walker PT Goal: Ambulate - Progress: Progressing toward goal Pt will Go Up / Down Stairs: 3-5 stairs;with min assist;with rolling walker (not assessed) Pt will Perform Home Exercise Program: with min assist PT Goal: Perform Home Exercise Program - Progress: Progressing toward goal  PT Treatment Precautions/Restrictions  Precautions Precautions: Other (comment) (none, direct anterior hip replacement) Precaution Comments: none; anterior direct THA Restrictions Weight Bearing Restrictions: No RLE Weight Bearing: Weight bearing as tolerated Mobility (including Balance) Bed Mobility Bed Mobility: Yes Supine to Sit: 4: Min assist Supine to Sit Details (indicate cue type and reason): pt 80%, assit to elevate trunk Sitting - Scoot to Edge of Bed: 6: Modified independent (Device/Increase time) Transfers Transfers: Yes Sit to Stand: 5: Supervision;With upper extremity assist;From bed Sit to Stand Details (indicate cue type and reason): VCs hand placement Stand to Sit: 5: Supervision;To bed;With  upper extremity assist Stand to Sit Details: VCs hand placement Ambulation/Gait Ambulation/Gait: Yes Ambulation/Gait Assistance: 6: Modified independent (Device/Increase time) Ambulation Distance (Feet): 75 Feet Assistive device: Rolling walker Gait Pattern: Step-to pattern    Exercise  Total Joint Exercises Ankle Circles/Pumps: AROM;Both;20 reps;Supine End of Session PT - End of Session Equipment Utilized During Treatment: Gait belt Activity Tolerance: Patient limited by fatigue Patient left: in bed;with call bell in reach;with family/visitor present General Behavior During Session: [facility] for tasks performed Cognition: [facility] for tasks performed  Ralene Bathe Kistler 08/16/2011, 2:30 PM Tamala Ser PT 08/16/2011  [phone]

## 2011-08-17 LAB — CBC
Hemoglobin: 9.9 g/dL — ABNORMAL LOW (ref 12.0–15.0)
RBC: 3.16 MIL/uL — ABNORMAL LOW (ref 3.87–5.11)

## 2011-08-17 LAB — BASIC METABOLIC PANEL
CO2: 27 mEq/L (ref 19–32)
Glucose, Bld: 121 mg/dL — ABNORMAL HIGH (ref 70–99)
Potassium: 3.8 mEq/L (ref 3.5–5.1)
Sodium: 138 mEq/L (ref 135–145)

## 2011-08-17 LAB — GLUCOSE, CAPILLARY: Glucose-Capillary: 116 mg/dL — ABNORMAL HIGH (ref 70–99)

## 2011-08-17 MED ORDER — MELOXICAM 7.5 MG PO TABS: 7.5000 mg | ORAL_TABLET | Freq: Two times a day (BID) | ORAL | Status: AC

## 2011-08-17 MED ORDER — HYDROCODONE-ACETAMINOPHEN 7.5-325 MG PO TABS: 1.0000 | ORAL_TABLET | ORAL | Status: AC | PRN

## 2011-08-17 NOTE — Progress Notes (Addendum)
Physical Therapy Treatment Patient Details Name: Kristin Byrd MRN: [id number] DOB: 1951-04-21 Today's Date: 08/17/2011  10:15-10:40 G, TE  PT Assessment/Plan  PT - Assessment/Plan Comments on Treatment Session: continued good progress, did well with stair training, reviewed HEP, ready for DC from PT standpoint PT Plan: Discharge plan remains appropriate PT Frequency: 7X/week Follow Up Recommendations: Home health PT Equipment Recommended: 3 in 1 bedside comode PT Goals  Acute Rehab PT Goals PT Goal Formulation: With patient Time For Goal Achievement: 3 days Pt will go Supine/Side to Sit: with modified independence;with HOB 0 degrees PT Goal: Supine/Side to Sit - Progress: Met Pt will go Sit to Stand: with modified independence PT Goal: Sit to Stand - Progress: Met Pt will Ambulate: >150 feet;with modified independence;with rolling walker PT Goal: Ambulate - Progress: Met Pt will Go Up / Down Stairs: 3-5 stairs;with min assist;with rolling walker PT Goal: Up/Down Stairs - Progress: Met Pt will Perform Home Exercise Program: with min assist PT Goal: Perform Home Exercise Program - Progress: Met  PT Treatment Precautions/Restrictions  Precautions Precautions: Other (comment) (none, direct anterior hip replacement) Precaution Comments: none; anterior direct THA Restrictions Weight Bearing Restrictions: No RLE Weight Bearing: Weight bearing as tolerated Mobility (including Balance) Bed Mobility Bed Mobility: Yes Supine to Sit: 6: Modified independent (Device/Increase time);With rails Sitting - Scoot to Edge of Bed: 7: Independent Transfers Transfers: Yes Sit to Stand: 6: Modified independent (Device/Increase time);From bed;With upper extremity assist Stand to Sit: 6: Modified independent (Device/Increase time);With upper extremity assist;To chair/3-in-1;With armrests Ambulation/Gait Ambulation/Gait: Yes Ambulation/Gait Assistance: 6: Modified independent  (Device/Increase time) Ambulation Distance (Feet): 180 Feet Assistive device: Rolling walker Gait Pattern: Step-through pattern Stairs: Yes Stairs Assistance: 4: Min assist Stairs Assistance Details (indicate cue type and reason): min A to stabilize RW Stair Management Technique: No rails;Backwards;With walker Number of Stairs: 4     Exercise  Total Joint Exercises Ankle Circles/Pumps: AROM;Both;20 reps;Supine Quad Sets: Strengthening;Right;5 reps;Seated;AROM Short Arc Quad: 10 reps;AROM;Supine;Right Heel Slides: AAROM;Right;10 reps;Supine Hip ABduction/ADduction: AAROM;5 reps;Standing;Right Long Arc Quad: AROM;Right;Strengthening;10 reps;Seated Marching in Standing: AROM;Right;5 reps;Standing Standing Hip Extension: AROM;Right;5 reps;Standing End of Session PT - End of Session Equipment Utilized During Treatment: Gait belt Activity Tolerance: Patient tolerated treatment well Patient left: in chair;with call bell in reach;with family/visitor present Nurse Communication: Mobility status for transfers;Mobility status for ambulation (ready for DC from PT standpoint) General Behavior During Session: [facility] for tasks performed Cognition: [facility] for tasks performed  Tamala Ser 08/17/2011, 10:56 AM [phone]

## 2011-08-17 NOTE — Progress Notes (Signed)
08/17/2011 Raynelle Bring BSN CCM [phone] Pt for discharge to home with HHpt. Genevieve Norlander will start services with start date of tomorrow. Genevieve Norlander has arranged for dme-3n1

## 2011-08-17 NOTE — Progress Notes (Signed)
Patient given discharge instructions and prescriptions. Verbalized understanding. Requested robaxin before leaving, given. Iv site removed, catheter tip intact. Physical therapy worked with patient prior to leaving. Left unit in wheelchair accompanied by family and staff.

## 2011-08-17 NOTE — Progress Notes (Signed)
Subjective: 2 Days Post-Op Procedure(s) (LRB): TOTAL HIP ARTHROPLASTY ANTERIOR APPROACH (Right)   Patient reports pain as mild. No events. Ready to go home.  Objective:   VITALS:   Filed Vitals:   08/17/11 0600  BP: 100/62  Pulse: 93  Temp: 98.8 F (37.1 C)  Resp: 18    Neurovascular intact Dorsiflexion/Plantar flexion intact Incision: dressing C/D/I No cellulitis present Compartment soft  LABS  Basename 08/17/11 0400 08/16/11 0428  HGB 9.9* 10.4*  HCT 29.0* 29.7*  WBC 9.3 10.2  PLT 221 250     Basename 08/17/11 0400 08/16/11 0428  NA 138 132*  K 3.8 4.0  BUN 8 7  CREATININE 0.66 0.52  GLUCOSE 121* 158*   Assessment/Plan: 2 Days Post-Op Procedure(s) (LRB): TOTAL HIP ARTHROPLASTY ANTERIOR APPROACH (Right)   Up with therapy Discharge home with home health after PT   Anastasio Auerbach. Rebecca Cairns   PAC  08/17/2011, 7:42 AM

## 2011-08-22 ENCOUNTER — Encounter (HOSPITAL_COMMUNITY): Payer: Self-pay | Admitting: Orthopedic Surgery

## 2011-08-23 ENCOUNTER — Other Ambulatory Visit (HOSPITAL_COMMUNITY): Payer: Managed Care, Other (non HMO)

## 2012-10-22 IMAGING — CR DG HIP 1V PORT*R*
1 series · 1 of 1 positions shown · non-contrast
Comparison: None.

CLINICAL DATA: Total hip arthroplasty.

PORTABLE RIGHT HIP - 1 VIEW

[AP]
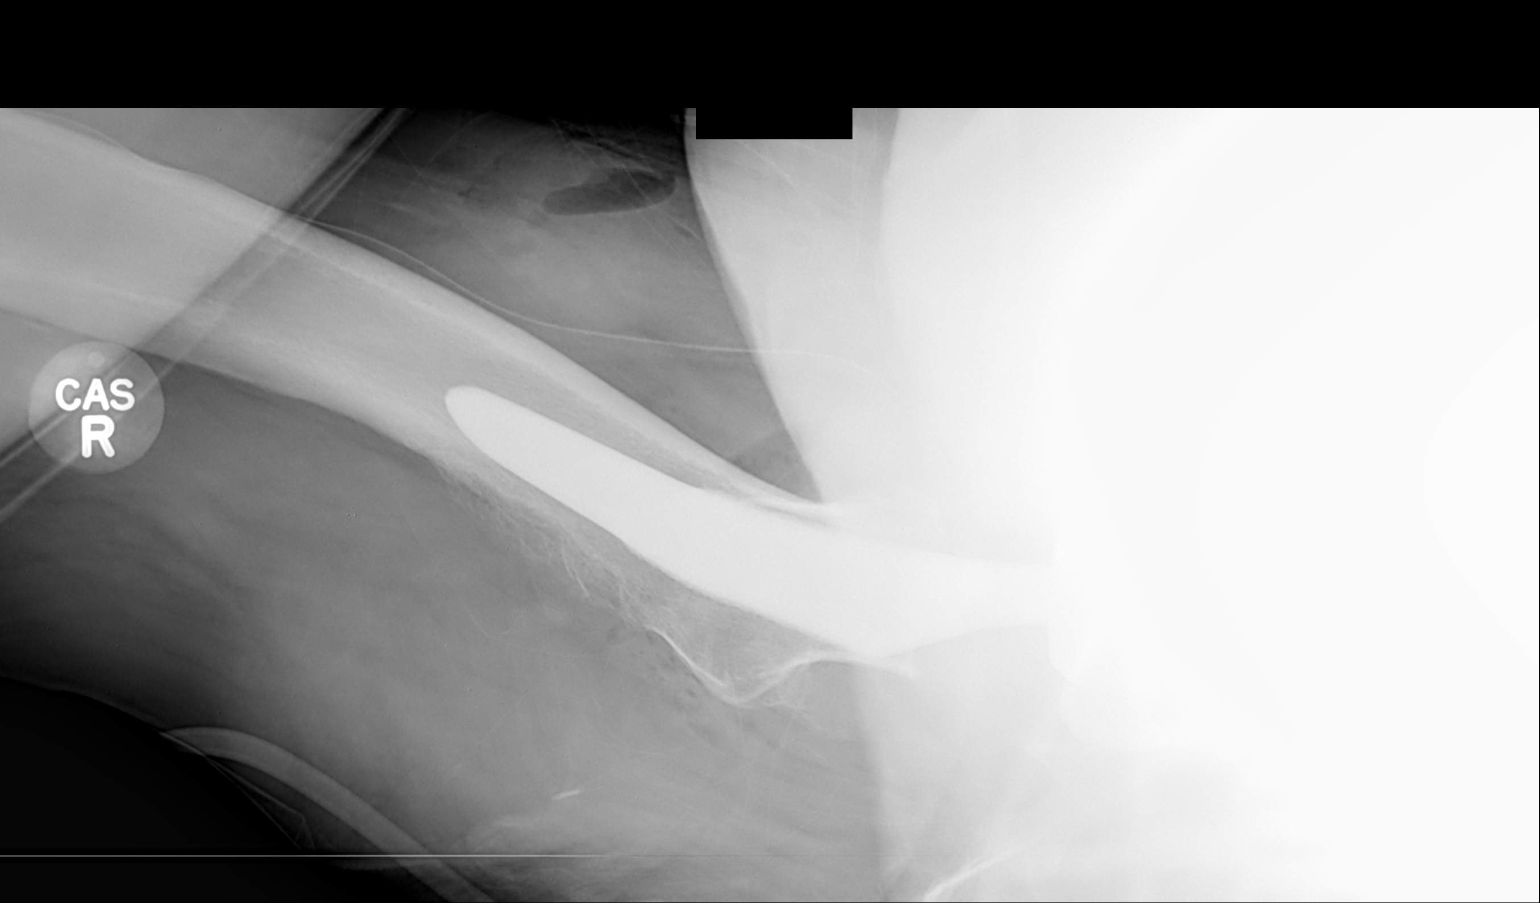

[1 of 1 positions shown; findings below may reference images not displayed]

FINDINGS: Lateral view right hip demonstrates incompletely imaged
arthroplasty.  No acute hardware complication or periprosthetic
fracture identified.
IMPRESSION: Expected appearance after right hip arthroplasty.

## 2014-07-27 NOTE — Patient Instructions (Addendum)
Kristin Byrd  07/27/2014   Your procedure is scheduled on:  08/04/2014    Come thru the [place].    Follow the Signs to [place] at  0830      am  Call this number if you have problems the morning of surgery: [phone]   Remember: Eat a good healthy snack prior to bedtime    Do not eat food or drink liquids after midnight.   Take these medicines the morning of surgery with A SIP OF WATER: Norvasc ( Amlodipine ) , Nasonex nasal spray , Singular   Do not wear jewelry, make-up or nail polish.  Do not wear lotions, powders, or perfumes.  deodorant.  Do not shave 48 hours prior to surgery.   Do not bring valuables to the hospital.  Contacts, dentures or bridgework may not be worn into surgery.  Leave suitcase in the car. After surgery it may be brought to your room.  For patients admitted to the hospital, checkout time is 11:00 AM the day of  discharge.     Please read over the following fact sheets that you were given: MRSA Information, coughing and deep breathing exercises, leg exercises            [facility] - Preparing for Surgery Before surgery, you can play an important role.  Because skin is not sterile, your skin needs to be as free of germs as possible.  You can reduce the number of germs on your skin by washing with CHG (chlorahexidine gluconate) soap before surgery.  CHG is an antiseptic cleaner which kills germs and bonds with the skin to continue killing germs even after washing. Please DO NOT use if you have an allergy to CHG or antibacterial soaps.  If your skin becomes reddened/irritated stop using the CHG and inform your nurse when you arrive at Short Stay. Do not shave (including legs and underarms) for at least 48 hours prior to the first CHG shower.  You may shave your face/neck. Please follow these instructions carefully:  1.  Shower with CHG Soap the night before surgery and the  morning of Surgery.  2.  If you choose to wash your hair,  wash your hair first as usual with your  normal  shampoo.  3.  After you shampoo, rinse your hair and body thoroughly to remove the  shampoo.                           4.  Use CHG as you would any other liquid soap.  You can apply chg directly  to the skin and wash                       Gently with a scrungie or clean washcloth.  5.  Apply the CHG Soap to your body ONLY FROM THE NECK DOWN.   Do not use on face/ open                           Wound or open sores. Avoid contact with eyes, ears mouth and genitals (private parts).                       Wash face,  Genitals (private parts) with your normal soap.             6.  Wash thoroughly, paying  special attention to the area where your surgery  will be performed.  7.  Thoroughly rinse your body with warm water from the neck down.  8.  DO NOT shower/wash with your normal soap after using and rinsing off  the CHG Soap.                9.  Pat yourself dry with a clean towel.            10.  Wear clean pajamas.            11.  Place clean sheets on your bed the night of your first shower and do not  sleep with pets. Day of Surgery : Do not apply any lotions/deodorants the morning of surgery.  Please wear clean clothes to the hospital/surgery center.  FAILURE TO FOLLOW THESE INSTRUCTIONS MAY RESULT IN THE CANCELLATION OF YOUR SURGERY PATIENT SIGNATURE_________________________________  NURSE SIGNATURE__________________________________  ________________________________________________________________________  WHAT IS A BLOOD TRANSFUSION? Blood Transfusion Information  A transfusion is the replacement of blood or some of its parts. Blood is made up of multiple cells which provide different functions.  Red blood cells carry oxygen and are used for blood loss replacement.  White blood cells fight against infection.  Platelets control bleeding.  Plasma helps clot blood.  Other blood products are available for specialized needs, such as  hemophilia or other clotting disorders. BEFORE THE TRANSFUSION  Who gives blood for transfusions?   Healthy volunteers who are fully evaluated to make sure their blood is safe. This is blood bank blood. Transfusion therapy is the safest it has ever been in the practice of medicine. Before blood is taken from a donor, a complete history is taken to make sure that person has no history of diseases nor engages in risky social behavior (examples are intravenous drug use or sexual activity with multiple partners). The donor's travel history is screened to minimize risk of transmitting infections, such as malaria. The donated blood is tested for signs of infectious diseases, such as HIV and hepatitis. The blood is then tested to be sure it is compatible with you in order to minimize the chance of a transfusion reaction. If you or a relative donates blood, this is often done in anticipation of surgery and is not appropriate for emergency situations. It takes many days to process the donated blood. RISKS AND COMPLICATIONS Although transfusion therapy is very safe and saves many lives, the main dangers of transfusion include:  1. Getting an infectious disease. 2. Developing a transfusion reaction. This is an allergic reaction to something in the blood you were given. Every precaution is taken to prevent this. The decision to have a blood transfusion has been considered carefully by your caregiver before blood is given. Blood is not given unless the benefits outweigh the risks. AFTER THE TRANSFUSION  Right after receiving a blood transfusion, you will usually feel much better and more energetic. This is especially true if your red blood cells have gotten low (anemic). The transfusion raises the level of the red blood cells which carry oxygen, and this usually causes an energy increase.  The nurse administering the transfusion will monitor you carefully for complications. HOME CARE INSTRUCTIONS  No special  instructions are needed after a transfusion. You may find your energy is better. Speak with your caregiver about any limitations on activity for underlying diseases you may have. SEEK MEDICAL CARE IF:   Your condition is not improving after your transfusion.  You develop redness  or irritation at the intravenous (IV) site. SEEK IMMEDIATE MEDICAL CARE IF:  Any of the following symptoms occur over the next 12 hours:  Shaking chills.  You have a temperature by mouth above 102 F (38.9 C), not controlled by medicine.  Chest, back, or muscle pain.  People around you feel you are not acting correctly or are confused.  Shortness of breath or difficulty breathing.  Dizziness and fainting.  You get a rash or develop hives.  You have a decrease in urine output.  Your urine turns a dark color or changes to pink, red, or brown. Any of the following symptoms occur over the next 10 days:  You have a temperature by mouth above 102 F (38.9 C), not controlled by medicine.  Shortness of breath.  Weakness after normal activity.  The white part of the eye turns yellow (jaundice).  You have a decrease in the amount of urine or are urinating less often.  Your urine turns a dark color or changes to pink, red, or brown. Document Released: 08/25/2000 Document Revised: 11/20/2011 Document Reviewed: 04/13/2008 ExitCare Patient Information 2014 [place].  _______________________________________________________________________  Incentive Spirometer  An incentive spirometer is a tool that can help keep your lungs clear and active. This tool measures how well you are filling your lungs with each breath. Taking long deep breaths may help reverse or decrease the chance of developing breathing (pulmonary) problems (especially infection) following:  A long period of time when you are unable to move or be active. BEFORE THE PROCEDURE   If the spirometer includes an indicator to show your best  effort, your nurse or respiratory therapist will set it to a desired goal.  If possible, sit up straight or lean slightly forward. Try not to slouch.  Hold the incentive spirometer in an upright position. INSTRUCTIONS FOR USE  3. Sit on the edge of your bed if possible, or sit up as far as you can in bed or on a chair. 4. Hold the incentive spirometer in an upright position. 5. Breathe out normally. 6. Place the mouthpiece in your mouth and seal your lips tightly around it. 7. Breathe in slowly and as deeply as possible, raising the piston or the ball toward the top of the column. 8. Hold your breath for 3-5 seconds or for as long as possible. Allow the piston or ball to fall to the bottom of the column. 9. Remove the mouthpiece from your mouth and breathe out normally. 10. Rest for a few seconds and repeat Steps 1 through 7 at least 10 times every 1-2 hours when you are awake. Take your time and take a few normal breaths between deep breaths. 11. The spirometer may include an indicator to show your best effort. Use the indicator as a goal to work toward during each repetition. 12. After each set of 10 deep breaths, practice coughing to be sure your lungs are clear. If you have an incision (the cut made at the time of surgery), support your incision when coughing by placing a pillow or rolled up towels firmly against it. Once you are able to get out of bed, walk around indoors and cough well. You may stop using the incentive spirometer when instructed by your caregiver.  RISKS AND COMPLICATIONS  Take your time so you do not get dizzy or light-headed.  If you are in pain, you may need to take or ask for pain medication before doing incentive spirometry. It is harder to take a  deep breath if you are having pain. AFTER USE  Rest and breathe slowly and easily.  It can be helpful to keep track of a log of your progress. Your caregiver can provide you with a simple table to help with this. If  you are using the spirometer at home, follow these instructions: [place] IF:   You are having difficultly using the spirometer.  You have trouble using the spirometer as often as instructed.  Your pain medication is not giving enough relief while using the spirometer.  You develop fever of 100.5 F (38.1 C) or higher. SEEK IMMEDIATE MEDICAL CARE IF:   You cough up bloody sputum that had not been present before.  You develop fever of 102 F (38.9 C) or greater.  You develop worsening pain at or near the incision site. MAKE SURE YOU:   Understand these instructions.  Will watch your condition.  Will get help right away if you are not doing well or get worse. Document Released: 01/08/2007 Document Revised: 11/20/2011 Document Reviewed: 03/11/2007 [facility] Patient Information 2014 [place], [place].   ________________________________________________________________________

## 2014-07-28 ENCOUNTER — Encounter (HOSPITAL_COMMUNITY): Payer: Self-pay | Admitting: *Deleted

## 2014-07-28 ENCOUNTER — Encounter (HOSPITAL_COMMUNITY)
Admission: RE | Admit: 2014-07-28 | Discharge: 2014-07-28 | Disposition: A | Discharge status: Home / Self Care | Payer: Managed Care, Other (non HMO) | Source: Ambulatory Visit | Attending: Orthopedic Surgery | Admitting: Orthopedic Surgery

## 2014-07-28 ENCOUNTER — Ambulatory Visit (HOSPITAL_COMMUNITY)
Admission: RE | Admit: 2014-07-28 | Discharge: 2014-07-28 | Disposition: A | Discharge status: Home / Self Care | Payer: Managed Care, Other (non HMO) | Source: Ambulatory Visit | Attending: Orthopedic Surgery | Admitting: Orthopedic Surgery

## 2014-07-28 DIAGNOSIS — Z01818 Encounter for other preprocedural examination: Secondary | ICD-10-CM | POA: Diagnosis not present

## 2014-07-28 DIAGNOSIS — I10 Essential (primary) hypertension: Secondary | ICD-10-CM

## 2014-07-28 LAB — URINALYSIS, ROUTINE W REFLEX MICROSCOPIC
Bilirubin Urine: NEGATIVE
Glucose, UA: NEGATIVE mg/dL
Hgb urine dipstick: NEGATIVE
Ketones, ur: NEGATIVE mg/dL
Nitrite: NEGATIVE
Protein, ur: NEGATIVE mg/dL
Specific Gravity, Urine: 1.012 (ref 1.005–1.030)
Urobilinogen, UA: 0.2 mg/dL (ref 0.0–1.0)
pH: 5 (ref 5.0–8.0)

## 2014-07-28 LAB — BASIC METABOLIC PANEL
ANION GAP: 14 (ref 5–15)
BUN: 19 mg/dL (ref 6–23)
CO2: 25 mEq/L (ref 19–32)
CREATININE: 0.81 mg/dL (ref 0.50–1.10)
Calcium: 9.8 mg/dL (ref 8.4–10.5)
Chloride: 93 mEq/L — ABNORMAL LOW (ref 96–112)
GFR calc non Af Amer: 75 mL/min — ABNORMAL LOW (ref >90)
GFR, EST AFRICAN AMERICAN: 86 mL/min — AB (ref >90)
Glucose, Bld: 111 mg/dL — ABNORMAL HIGH (ref 70–99)
Potassium: 4.5 mEq/L (ref 3.7–5.3)
Sodium: 132 mEq/L — ABNORMAL LOW (ref 137–147)

## 2014-07-28 LAB — CBC
HCT: 41.7 % (ref 36.0–46.0)
Hemoglobin: 14.3 g/dL (ref 12.0–15.0)
MCH: 31.1 pg (ref 26.0–34.0)
MCHC: 34.3 g/dL (ref 30.0–36.0)
MCV: 90.7 fL (ref 78.0–100.0)
Platelets: 304 10*3/uL (ref 150–400)
RBC: 4.6 MIL/uL (ref 3.87–5.11)
RDW: 11.8 % (ref 11.5–15.5)
WBC: 9.6 10*3/uL (ref 4.0–10.5)

## 2014-07-28 LAB — SURGICAL PCR SCREEN
MRSA, PCR: NEGATIVE
STAPHYLOCOCCUS AUREUS: NEGATIVE

## 2014-07-28 LAB — URINE MICROSCOPIC-ADD ON

## 2014-07-28 LAB — APTT: APTT: 27 s (ref 24–37)

## 2014-07-28 LAB — PROTIME-INR
INR: 1 (ref 0.00–1.49)
PROTHROMBIN TIME: 13.3 s (ref 11.6–15.2)

## 2014-07-28 NOTE — Progress Notes (Signed)
Clearance- 05/07/2014 Dr Unk Lightning on chart  LOV 05/07/14 Dr Unk Lightning on chart  EKG- 05/07/14 on chart

## 2014-08-02 NOTE — H&P (Signed)
TOTAL HIP ADMISSION H&P  Patient is admitted for left total hip arthroplasty, anterior approach.  Subjective:  Chief Complaint:    Left hip primary OA / pain  HPI: Kristin Byrd, 63 y.o. female, has a history of pain and functional disability in the left hip(s) due to arthritis and patient has failed non-surgical conservative treatments for greater than 12 weeks to include NSAID's and/or analgesics, supervised PT with diminished ADL's post treatment and activity modification.  Onset of symptoms was gradual starting 2+ years ago with gradually worsening course since that time.The patient noted prior procedures of the hip to include arthroplasty on the right hip per Dr. Alvan Dame on 08/15/2011.  Patient currently rates pain in the bilaterally hip at 8 out of 10 with activity. Patient has worsening of pain with activity and weight bearing, trendelenberg gait, pain that interfers with activities of daily living and pain with passive range of motion. Patient has evidence of periarticular osteophytes and joint space narrowing by imaging studies. This condition presents safety issues increasing the risk of falls. There is no current active infection.  Risks, benefits and expectations were discussed with the patient.  Risks including but not limited to the risk of anesthesia, blood clots, nerve damage, blood vessel damage, failure of the prosthesis, infection and up to and including death.  Patient understand the risks, benefits and expectations and wishes to proceed with surgery.   PCP: Myrlene Broker, MD  D/C Plans:      Home with HHPT  Post-op Meds:        Rx given for ASA, Robaxin, Celebrex, Iron, Colace and MiraLax  Tranexamic Acid:      To be given - IV    Decadron:      Is to be given  FYI:     ASA post-op    Previous Components:   Right THA, anterior approach utilizing Depuy components  size 54 pinnacle cup  size 36+4 neutralAltrex liner  size 7Hi Tri Lock stem   36+1.5 delta  ceramic ball     Patient Active Problem List   Diagnosis Date Noted  . S/P right total hip replacement 08/16/2011   Past Medical History  Diagnosis Date  . PONV (postoperative nausea and vomiting)   . Hypertension   . Arthritis   . Diabetes mellitus     Past Surgical History  Procedure Laterality Date  . Dilation and curettage of uterus    . Cholecystectomy    . L wirst      CYST REMOVED  . Dental surgery    . Total hip arthroplasty  08/15/2011    Procedure: TOTAL HIP ARTHROPLASTY ANTERIOR APPROACH;  Surgeon: Mauri Pole;  Location: [facility];  Service: Orthopedics;  Laterality: Right;    No prescriptions prior to admission   Allergies  Allergen Reactions  . Hydrocodone Itching  . Oxycodone Itching  . Shellfish Allergy Nausea And Vomiting    History  Substance Use Topics  . Smoking status: Never Smoker   . Smokeless tobacco: Never Used  . Alcohol Use: Yes     Comment: rarely    Family History  Problem Relation Age of Onset  . Stroke Father      Review of Systems  Constitutional: Negative.   HENT: Negative.   Eyes: Negative.   Respiratory: Negative.   Cardiovascular: Negative.   Gastrointestinal: Negative.   Genitourinary: Negative.   Musculoskeletal: Positive for joint pain.  Skin: Negative.   Neurological: Negative.   Endo/Heme/Allergies: Negative.  Psychiatric/Behavioral: Negative.     Objective:  Physical Exam  Constitutional: She is oriented to person, place, and time. She appears well-developed and well-nourished.  HENT:  Head: Normocephalic and atraumatic.  Eyes: Pupils are equal, round, and reactive to light.  Neck: Neck supple. No JVD present. No tracheal deviation present. No thyromegaly present.  Cardiovascular: Normal rate, regular rhythm, normal heart sounds and intact distal pulses.   Respiratory: Effort normal and breath sounds normal. No stridor. No respiratory distress. She has no wheezes.  GI: Soft. There is no tenderness. There  is no guarding.  Musculoskeletal:       Left hip: She exhibits decreased range of motion, decreased strength, tenderness and bony tenderness. She exhibits no swelling, no deformity and no laceration.  Lymphadenopathy:    She has no cervical adenopathy.  Neurological: She is alert and oriented to person, place, and time.  Skin: Skin is warm and dry.  Psychiatric: She has a normal mood and affect.      Labs:  Estimated body mass index is 33.09 kg/(m^2) as calculated from the following:   Height as of 08/15/11: 5\' 9"  (1.753 m).   Weight as of 08/14/11: 101.691 kg (224 lb 3 oz).   Imaging Review Plain radiographs demonstrate severe degenerative joint disease of the left hip(s). The bone quality appears to be good for age and reported activity level.  Assessment/Plan:  End stage arthritis, left hip(s)  The patient history, physical examination, clinical judgement of the provider and imaging studies are consistent with end stage degenerative joint disease of the left hip(s) and total hip arthroplasty is deemed medically necessary. The treatment options including medical management, injection therapy, arthroscopy and arthroplasty were discussed at length. The risks and benefits of total hip arthroplasty were presented and reviewed. The risks due to aseptic loosening, infection, stiffness, dislocation/subluxation,  thromboembolic complications and other imponderables were discussed.  The patient acknowledged the explanation, agreed to proceed with the plan and consent was signed. Patient is being admitted for inpatient treatment for surgery, pain control, PT, OT, prophylactic antibiotics, VTE prophylaxis, progressive ambulation and ADL's and discharge planning.The patient is planning to be discharged home with home health services.     [place] Pugh Dystany Duffy   PA-C  08/02/2014, 10:44 PM

## 2014-08-04 ENCOUNTER — Inpatient Hospital Stay (HOSPITAL_COMMUNITY): Payer: Managed Care, Other (non HMO)

## 2014-08-04 ENCOUNTER — Inpatient Hospital Stay (HOSPITAL_COMMUNITY)
Admission: RE | Admit: 2014-08-04 | Discharge: 2014-08-05 | DRG: 470 | Disposition: A | Discharge status: Home Health | Payer: Managed Care, Other (non HMO) | Source: Ambulatory Visit | Attending: Orthopedic Surgery | Admitting: Orthopedic Surgery

## 2014-08-04 ENCOUNTER — Inpatient Hospital Stay (HOSPITAL_COMMUNITY): Payer: Managed Care, Other (non HMO) | Admitting: Anesthesiology

## 2014-08-04 ENCOUNTER — Encounter (HOSPITAL_COMMUNITY)
Admission: RE | Disposition: A | Discharge status: Home Health | Payer: Self-pay | Source: Ambulatory Visit | Attending: Orthopedic Surgery

## 2014-08-04 ENCOUNTER — Encounter (HOSPITAL_COMMUNITY): Payer: Self-pay | Admitting: *Deleted

## 2014-08-04 DIAGNOSIS — I10 Essential (primary) hypertension: Secondary | ICD-10-CM | POA: Diagnosis present

## 2014-08-04 DIAGNOSIS — E119 Type 2 diabetes mellitus without complications: Secondary | ICD-10-CM | POA: Diagnosis present

## 2014-08-04 DIAGNOSIS — E669 Obesity, unspecified: Secondary | ICD-10-CM | POA: Diagnosis present

## 2014-08-04 DIAGNOSIS — Z96649 Presence of unspecified artificial hip joint: Secondary | ICD-10-CM

## 2014-08-04 DIAGNOSIS — Z6832 Body mass index (BMI) 32.0-32.9, adult: Secondary | ICD-10-CM | POA: Diagnosis not present

## 2014-08-04 DIAGNOSIS — Z96641 Presence of right artificial hip joint: Secondary | ICD-10-CM | POA: Diagnosis present

## 2014-08-04 DIAGNOSIS — M25552 Pain in left hip: Secondary | ICD-10-CM | POA: Diagnosis present

## 2014-08-04 DIAGNOSIS — M1612 Unilateral primary osteoarthritis, left hip: Secondary | ICD-10-CM | POA: Diagnosis present

## 2014-08-04 HISTORY — PX: TOTAL HIP ARTHROPLASTY: SHX124

## 2014-08-04 LAB — TYPE AND SCREEN
ABO/RH(D): O POS
Antibody Screen: NEGATIVE

## 2014-08-04 LAB — GLUCOSE, CAPILLARY
GLUCOSE-CAPILLARY: 165 mg/dL — AB (ref 70–99)
Glucose-Capillary: 135 mg/dL — ABNORMAL HIGH (ref 70–99)
Glucose-Capillary: 176 mg/dL — ABNORMAL HIGH (ref 70–99)
Glucose-Capillary: 166 mg/dL — ABNORMAL HIGH (ref 70–99)

## 2014-08-04 SURGERY — ARTHROPLASTY, HIP, TOTAL, ANTERIOR APPROACH
Anesthesia: General | Site: Hip | Laterality: Left

## 2014-08-04 MED ORDER — FENTANYL CITRATE 0.05 MG/ML IJ SOLN
INTRAMUSCULAR | Status: DC | PRN
  Administered 2014-08-04: 150 ug via INTRAVENOUS
  Administered 2014-08-04 (×2): 100 ug via INTRAVENOUS

## 2014-08-04 MED ORDER — TRAMADOL HCL 50 MG PO TABS
50.0000 mg | ORAL_TABLET | Freq: Four times a day (QID) | ORAL | Status: DC | PRN
  Administered 2014-08-04: 100 mg via ORAL
  Filled 2014-08-04: qty 2

## 2014-08-04 MED ORDER — HYDROCHLOROTHIAZIDE 25 MG PO TABS
25.0000 mg | ORAL_TABLET | Freq: Every day | ORAL | Status: DC
  Administered 2014-08-05: 25 mg via ORAL
  Filled 2014-08-04: qty 1

## 2014-08-04 MED ORDER — DIPHENHYDRAMINE HCL 25 MG PO CAPS: 25.0000 mg | ORAL_CAPSULE | Freq: Four times a day (QID) | ORAL | Status: DC | PRN

## 2014-08-04 MED ORDER — DEXAMETHASONE SODIUM PHOSPHATE 10 MG/ML IJ SOLN
10.0000 mg | Freq: Once | INTRAMUSCULAR | Status: AC
  Administered 2014-08-05: 10 mg via INTRAVENOUS
  Filled 2014-08-04 (×2): qty 1

## 2014-08-04 MED ORDER — SODIUM CHLORIDE 0.9 % IV SOLN
100.0000 mL/h | INTRAVENOUS | Status: DC
  Administered 2014-08-04: 100 mL/h via INTRAVENOUS
  Filled 2014-08-04 (×4): qty 1000

## 2014-08-04 MED ORDER — FENTANYL CITRATE 0.05 MG/ML IJ SOLN
INTRAMUSCULAR | Status: AC
  Filled 2014-08-04: qty 5

## 2014-08-04 MED ORDER — CEFAZOLIN SODIUM-DEXTROSE 2-3 GM-% IV SOLR
INTRAVENOUS | Status: AC
  Filled 2014-08-04: qty 50

## 2014-08-04 MED ORDER — ACETAMINOPHEN 325 MG PO TABS: 325.0000 mg | ORAL_TABLET | Freq: Four times a day (QID) | ORAL | Status: DC | PRN

## 2014-08-04 MED ORDER — MAGNESIUM CITRATE PO SOLN: 1.0000 | Freq: Once | ORAL | Status: AC | PRN

## 2014-08-04 MED ORDER — ROCURONIUM BROMIDE 100 MG/10ML IV SOLN
INTRAVENOUS | Status: AC
  Filled 2014-08-04: qty 1

## 2014-08-04 MED ORDER — ONDANSETRON HCL 4 MG/2ML IJ SOLN
INTRAMUSCULAR | Status: DC | PRN
  Administered 2014-08-04: 4 mg via INTRAVENOUS

## 2014-08-04 MED ORDER — FENTANYL CITRATE 0.05 MG/ML IJ SOLN
INTRAMUSCULAR | Status: AC
  Filled 2014-08-04: qty 2

## 2014-08-04 MED ORDER — HYDROMORPHONE HCL 1 MG/ML IJ SOLN
INTRAMUSCULAR | Status: AC
  Filled 2014-08-04: qty 1

## 2014-08-04 MED ORDER — INSULIN ASPART 100 UNIT/ML ~~LOC~~ SOLN
0.0000 [IU] | Freq: Three times a day (TID) | SUBCUTANEOUS | Status: DC
  Administered 2014-08-04 – 2014-08-05 (×2): 3 [IU] via SUBCUTANEOUS

## 2014-08-04 MED ORDER — HYDROMORPHONE HCL 2 MG/ML IJ SOLN
INTRAMUSCULAR | Status: AC
  Filled 2014-08-04: qty 1

## 2014-08-04 MED ORDER — MIDAZOLAM HCL 2 MG/2ML IJ SOLN
INTRAMUSCULAR | Status: AC
  Filled 2014-08-04: qty 2

## 2014-08-04 MED ORDER — HYDROMORPHONE HCL 1 MG/ML IJ SOLN
0.5000 mg | INTRAMUSCULAR | Status: DC | PRN
  Administered 2014-08-04: 0.5 mg via INTRAVENOUS
  Administered 2014-08-04: 1 mg via INTRAVENOUS
  Filled 2014-08-04: qty 1

## 2014-08-04 MED ORDER — ROCURONIUM BROMIDE 100 MG/10ML IV SOLN
INTRAVENOUS | Status: DC | PRN
  Administered 2014-08-04: 5 mg via INTRAVENOUS

## 2014-08-04 MED ORDER — PROPOFOL 10 MG/ML IV BOLUS
INTRAVENOUS | Status: AC
  Filled 2014-08-04: qty 20

## 2014-08-04 MED ORDER — IRBESARTAN 300 MG PO TABS
300.0000 mg | ORAL_TABLET | Freq: Every day | ORAL | Status: DC
  Administered 2014-08-05: 300 mg via ORAL
  Filled 2014-08-04: qty 1

## 2014-08-04 MED ORDER — GLYCOPYRROLATE 0.2 MG/ML IJ SOLN
INTRAMUSCULAR | Status: DC | PRN
  Administered 2014-08-04: 0.4 mg via INTRAVENOUS

## 2014-08-04 MED ORDER — NEOSTIGMINE METHYLSULFATE 10 MG/10ML IV SOLN
INTRAVENOUS | Status: DC | PRN
  Administered 2014-08-04: 3 mg via INTRAVENOUS

## 2014-08-04 MED ORDER — CELECOXIB 200 MG PO CAPS
200.0000 mg | ORAL_CAPSULE | Freq: Two times a day (BID) | ORAL | Status: DC
  Administered 2014-08-04 – 2014-08-05 (×2): 200 mg via ORAL
  Filled 2014-08-04 (×3): qty 1

## 2014-08-04 MED ORDER — DOCUSATE SODIUM 100 MG PO CAPS
100.0000 mg | ORAL_CAPSULE | Freq: Two times a day (BID) | ORAL | Status: DC
  Administered 2014-08-04 – 2014-08-05 (×2): 100 mg via ORAL

## 2014-08-04 MED ORDER — DEXAMETHASONE SODIUM PHOSPHATE 10 MG/ML IJ SOLN
10.0000 mg | Freq: Once | INTRAMUSCULAR | Status: AC
  Administered 2014-08-04: 10 mg via INTRAVENOUS

## 2014-08-04 MED ORDER — CEFAZOLIN SODIUM-DEXTROSE 2-3 GM-% IV SOLR
2.0000 g | INTRAVENOUS | Status: AC
  Administered 2014-08-04: 2 g via INTRAVENOUS

## 2014-08-04 MED ORDER — CHLORHEXIDINE GLUCONATE 4 % EX LIQD
60.0000 mL | Freq: Once | CUTANEOUS | Status: DC
Start: 2014-08-04 — End: 2014-08-04

## 2014-08-04 MED ORDER — ATORVASTATIN CALCIUM 10 MG PO TABS
10.0000 mg | ORAL_TABLET | Freq: Every day | ORAL | Status: DC
  Administered 2014-08-04: 10 mg via ORAL
  Filled 2014-08-04 (×3): qty 1

## 2014-08-04 MED ORDER — METFORMIN HCL 500 MG PO TABS
500.0000 mg | ORAL_TABLET | Freq: Two times a day (BID) | ORAL | Status: DC
  Administered 2014-08-04 – 2014-08-05 (×2): 500 mg via ORAL
  Filled 2014-08-04 (×4): qty 1

## 2014-08-04 MED ORDER — LIDOCAINE HCL (CARDIAC) 20 MG/ML IV SOLN
INTRAVENOUS | Status: AC
  Filled 2014-08-04: qty 5

## 2014-08-04 MED ORDER — AMLODIPINE BESYLATE 10 MG PO TABS
10.0000 mg | ORAL_TABLET | Freq: Every morning | ORAL | Status: DC
  Administered 2014-08-05: 10 mg via ORAL
  Filled 2014-08-04: qty 1

## 2014-08-04 MED ORDER — SUCCINYLCHOLINE CHLORIDE 20 MG/ML IJ SOLN
INTRAMUSCULAR | Status: DC | PRN
  Administered 2014-08-04: 100 mg via INTRAVENOUS

## 2014-08-04 MED ORDER — TRANEXAMIC ACID 100 MG/ML IV SOLN
1000.0000 mg | Freq: Once | INTRAVENOUS | Status: AC
  Administered 2014-08-04: 1000 mg via INTRAVENOUS
  Filled 2014-08-04: qty 10

## 2014-08-04 MED ORDER — ONDANSETRON HCL 4 MG/2ML IJ SOLN: 4.0000 mg | Freq: Four times a day (QID) | INTRAMUSCULAR | Status: DC | PRN

## 2014-08-04 MED ORDER — MENTHOL 3 MG MT LOZG: 1.0000 | LOZENGE | OROMUCOSAL | Status: DC | PRN

## 2014-08-04 MED ORDER — LIDOCAINE HCL (CARDIAC) 20 MG/ML IV SOLN
INTRAVENOUS | Status: DC | PRN
  Administered 2014-08-04: 50 mg via INTRAVENOUS

## 2014-08-04 MED ORDER — GLYCOPYRROLATE 0.2 MG/ML IJ SOLN
INTRAMUSCULAR | Status: AC
  Filled 2014-08-04: qty 2

## 2014-08-04 MED ORDER — MONTELUKAST SODIUM 10 MG PO TABS
10.0000 mg | ORAL_TABLET | Freq: Every day | ORAL | Status: DC
  Filled 2014-08-04: qty 1

## 2014-08-04 MED ORDER — PROMETHAZINE HCL 25 MG/ML IJ SOLN: 6.2500 mg | INTRAMUSCULAR | Status: DC | PRN

## 2014-08-04 MED ORDER — LACTATED RINGERS IV SOLN
INTRAVENOUS | Status: DC
  Administered 2014-08-04: 1000 mL via INTRAVENOUS

## 2014-08-04 MED ORDER — LORATADINE 10 MG PO TABS
10.0000 mg | ORAL_TABLET | Freq: Every day | ORAL | Status: DC
  Administered 2014-08-04: 10 mg via ORAL
  Filled 2014-08-04 (×2): qty 1

## 2014-08-04 MED ORDER — CEFAZOLIN SODIUM-DEXTROSE 2-3 GM-% IV SOLR
2.0000 g | Freq: Four times a day (QID) | INTRAVENOUS | Status: AC
  Administered 2014-08-04 (×2): 2 g via INTRAVENOUS
  Filled 2014-08-04 (×2): qty 50

## 2014-08-04 MED ORDER — HYDROCODONE-ACETAMINOPHEN 7.5-325 MG PO TABS
1.0000 | ORAL_TABLET | ORAL | Status: DC | PRN
  Administered 2014-08-04 – 2014-08-05 (×6): 2 via ORAL
  Filled 2014-08-04 (×6): qty 2

## 2014-08-04 MED ORDER — HYDROMORPHONE HCL 2 MG PO TABS: 2.0000 mg | ORAL_TABLET | ORAL | Status: DC | PRN

## 2014-08-04 MED ORDER — HYDROMORPHONE HCL 1 MG/ML IJ SOLN
INTRAMUSCULAR | Status: DC | PRN
  Administered 2014-08-04 (×2): 1 mg via INTRAVENOUS

## 2014-08-04 MED ORDER — METHOCARBAMOL 500 MG PO TABS
500.0000 mg | ORAL_TABLET | Freq: Four times a day (QID) | ORAL | Status: DC | PRN
  Administered 2014-08-04 – 2014-08-05 (×3): 500 mg via ORAL
  Filled 2014-08-04 (×3): qty 1

## 2014-08-04 MED ORDER — METHOCARBAMOL 1000 MG/10ML IJ SOLN
500.0000 mg | Freq: Four times a day (QID) | INTRAVENOUS | Status: DC | PRN
  Administered 2014-08-04: 500 mg via INTRAVENOUS
  Filled 2014-08-04 (×2): qty 5

## 2014-08-04 MED ORDER — MIDAZOLAM HCL 5 MG/5ML IJ SOLN
INTRAMUSCULAR | Status: DC | PRN
  Administered 2014-08-04: 2 mg via INTRAVENOUS

## 2014-08-04 MED ORDER — METOCLOPRAMIDE HCL 5 MG/ML IJ SOLN: 5.0000 mg | Freq: Three times a day (TID) | INTRAMUSCULAR | Status: DC | PRN

## 2014-08-04 MED ORDER — ONDANSETRON HCL 4 MG PO TABS: 4.0000 mg | ORAL_TABLET | Freq: Four times a day (QID) | ORAL | Status: DC | PRN

## 2014-08-04 MED ORDER — PROPOFOL 10 MG/ML IV BOLUS
INTRAVENOUS | Status: DC | PRN
  Administered 2014-08-04: 180 mg via INTRAVENOUS
  Administered 2014-08-04: 45 mg via INTRAVENOUS

## 2014-08-04 MED ORDER — ONDANSETRON HCL 4 MG/2ML IJ SOLN
INTRAMUSCULAR | Status: AC
  Filled 2014-08-04: qty 2

## 2014-08-04 MED ORDER — BISACODYL 10 MG RE SUPP: 10.0000 mg | Freq: Every day | RECTAL | Status: DC | PRN

## 2014-08-04 MED ORDER — FLUTICASONE PROPIONATE 50 MCG/ACT NA SUSP
1.0000 | Freq: Every day | NASAL | Status: DC
Start: 2014-08-05 — End: 2014-08-05
  Filled 2014-08-04: qty 16

## 2014-08-04 MED ORDER — DEXAMETHASONE SODIUM PHOSPHATE 10 MG/ML IJ SOLN
INTRAMUSCULAR | Status: AC
  Filled 2014-08-04: qty 1

## 2014-08-04 MED ORDER — ASPIRIN EC 325 MG PO TBEC
325.0000 mg | DELAYED_RELEASE_TABLET | Freq: Two times a day (BID) | ORAL | Status: DC
  Administered 2014-08-05: 325 mg via ORAL
  Filled 2014-08-04 (×3): qty 1

## 2014-08-04 MED ORDER — POLYETHYLENE GLYCOL 3350 17 G PO PACK
17.0000 g | PACK | Freq: Two times a day (BID) | ORAL | Status: DC
  Administered 2014-08-05: 17 g via ORAL

## 2014-08-04 MED ORDER — LACTATED RINGERS IV SOLN
INTRAVENOUS | Status: DC | PRN
  Administered 2014-08-04 (×2): via INTRAVENOUS

## 2014-08-04 MED ORDER — NEOSTIGMINE METHYLSULFATE 10 MG/10ML IV SOLN
INTRAVENOUS | Status: AC
  Filled 2014-08-04: qty 1

## 2014-08-04 MED ORDER — VALSARTAN-HYDROCHLOROTHIAZIDE 320-25 MG PO TABS: 1.0000 | ORAL_TABLET | Freq: Every morning | ORAL | Status: DC

## 2014-08-04 MED ORDER — ALUM & MAG HYDROXIDE-SIMETH 200-200-20 MG/5ML PO SUSP: 30.0000 mL | ORAL | Status: DC | PRN

## 2014-08-04 MED ORDER — METOCLOPRAMIDE HCL 10 MG PO TABS: 5.0000 mg | ORAL_TABLET | Freq: Three times a day (TID) | ORAL | Status: DC | PRN

## 2014-08-04 MED ORDER — 0.9 % SODIUM CHLORIDE (POUR BTL) OPTIME
TOPICAL | Status: DC | PRN
  Administered 2014-08-04: 1000 mL

## 2014-08-04 MED ORDER — FERROUS SULFATE 325 (65 FE) MG PO TABS
325.0000 mg | ORAL_TABLET | Freq: Three times a day (TID) | ORAL | Status: DC
  Administered 2014-08-04 – 2014-08-05 (×3): 325 mg via ORAL
  Filled 2014-08-04 (×5): qty 1

## 2014-08-04 MED ORDER — PHENOL 1.4 % MT LIQD: 1.0000 | OROMUCOSAL | Status: DC | PRN

## 2014-08-04 MED ORDER — HYDROMORPHONE HCL 1 MG/ML IJ SOLN
0.2500 mg | INTRAMUSCULAR | Status: DC | PRN
Start: 2014-08-04 — End: 2014-08-04
  Administered 2014-08-04 (×3): 0.5 mg via INTRAVENOUS

## 2014-08-04 SURGICAL SUPPLY — 37 items
BAG SPEC THK2 15X12 ZIP CLS (MISCELLANEOUS)
BAG ZIPLOCK 12X15 (MISCELLANEOUS) IMPLANT
CAPT HIP PF COP ×3 IMPLANT
COVER PERINEAL POST (MISCELLANEOUS) ×3 IMPLANT
DRAPE C-ARM 42X120 X-RAY (DRAPES) ×3 IMPLANT
DRAPE STERI IOBAN 125X83 (DRAPES) ×3 IMPLANT
DRAPE U-SHAPE 47X51 STRL (DRAPES) ×9 IMPLANT
DRSG AQUACEL AG ADV 3.5X10 (GAUZE/BANDAGES/DRESSINGS) ×3 IMPLANT
DURAPREP 26ML APPLICATOR (WOUND CARE) ×3 IMPLANT
ELECT BLADE TIP CTD 4 INCH (ELECTRODE) ×3 IMPLANT
ELECT REM PT RETURN 9FT ADLT (ELECTROSURGICAL) ×3
ELECTRODE REM PT RTRN 9FT ADLT (ELECTROSURGICAL) ×1 IMPLANT
FACESHIELD WRAPAROUND (MASK) ×12 IMPLANT
GLOVE BIO SURGEON STRL SZ7 (GLOVE) ×3 IMPLANT
GLOVE BIO SURGEON STRL SZ7.5 (GLOVE) ×3 IMPLANT
GLOVE BIOGEL M 7.0 STRL (GLOVE) ×3 IMPLANT
GLOVE BIOGEL PI IND STRL 6.5 (GLOVE) ×1 IMPLANT
GLOVE BIOGEL PI IND STRL 7.5 (GLOVE) ×3 IMPLANT
GLOVE BIOGEL PI INDICATOR 6.5 (GLOVE) ×2
GLOVE BIOGEL PI INDICATOR 7.5 (GLOVE) ×6
GLOVE ORTHO TXT STRL SZ7.5 (GLOVE) ×3 IMPLANT
GOWN STRL REUS W/TWL LRG LVL3 (GOWN DISPOSABLE) ×3 IMPLANT
GOWN STRL REUS W/TWL XL LVL3 (GOWN DISPOSABLE) ×9 IMPLANT
HOLDER FOLEY CATH W/STRAP (MISCELLANEOUS) ×3 IMPLANT
KIT BASIN OR (CUSTOM PROCEDURE TRAY) ×3 IMPLANT
LIQUID BAND (GAUZE/BANDAGES/DRESSINGS) ×3 IMPLANT
PACK TOTAL JOINT (CUSTOM PROCEDURE TRAY) ×3 IMPLANT
SAW OSC TIP CART 19.5X105X1.3 (SAW) ×3 IMPLANT
SUT MNCRL AB 4-0 PS2 18 (SUTURE) ×3 IMPLANT
SUT VIC AB 1 CT1 36 (SUTURE) ×9 IMPLANT
SUT VIC AB 2-0 CT1 27 (SUTURE) ×6
SUT VIC AB 2-0 CT1 TAPERPNT 27 (SUTURE) ×2 IMPLANT
SUT VLOC 180 0 24IN GS25 (SUTURE) ×3 IMPLANT
TOWEL OR 17X26 10 PK STRL BLUE (TOWEL DISPOSABLE) ×3 IMPLANT
TOWEL OR NON WOVEN STRL DISP B (DISPOSABLE) ×3 IMPLANT
TRAY FOLEY CATH 14FRSI W/METER (CATHETERS) ×3 IMPLANT
WATER STERILE IRR 1500ML POUR (IV SOLUTION) ×3 IMPLANT

## 2014-08-04 NOTE — Op Note (Signed)
NAME:  Kristin Byrd NO.: [id number]      MEDICAL RECORD NO.: [id number]      FACILITY:  [facility]      PHYSICIAN:  Paralee Cancel D  DATE OF BIRTH:  1951-06-05     DATE OF PROCEDURE:  08/04/2014                                 OPERATIVE REPORT         PREOPERATIVE DIAGNOSIS: Left  hip osteoarthritis.      POSTOPERATIVE DIAGNOSIS:  Left hip osteoarthritis.      PROCEDURE:  Left total hip replacement through an anterior approach   utilizing DePuy THR system, component size 43mm pinnacle cup, a size 36+4 neutral   Altrex liner, a size 7 Hi Tri Lock stem with a 36+1.5 delta ceramic   ball.      SURGEON:  Pietro Cassis. Alvan Dame, M.D.      ASSISTANT:  Nehemiah Massed, PA-C     ANESTHESIA:  General.      SPECIMENS:  None.      COMPLICATIONS:  None.      BLOOD LOSS:  400 cc     DRAINS:  None.      INDICATION OF THE PROCEDURE:  Kristin Byrd is a 63 y.o. female who had   presented to office for evaluation of left hip pain.  Radiographs revealed   progressive degenerative changes with bone-on-bone   articulation to the  hip joint.  The patient had painful limited range of   motion significantly affecting their overall quality of life.  The patient was failing to    respond to conservative measures, and at this point was ready   to proceed with more definitive measures.  The patient has noted progressive   degenerative changes in his hip, progressive problems and dysfunction   with regarding the hip prior to surgery.  Consent was obtained for   benefit of pain relief.  Specific risk of infection, DVT, component   failure, dislocation, need for revision surgery, as well discussion of   the anterior versus posterior approach were reviewed.  Consent was   obtained for benefit of anterior pain relief through an anterior   approach.      PROCEDURE IN DETAIL:  The patient was brought to operative theater.   Once adequate anesthesia,  preoperative antibiotics, 2gm of Ancef administered.   The patient was positioned supine on the OSI Hanna table.  Once adequate   padding of boney process was carried out, we had predraped out the hip, and  used fluoroscopy to confirm orientation of the pelvis and position.      The left hip was then prepped and draped from proximal iliac crest to   mid thigh with shower curtain technique.      Time-out was performed identifying the patient, planned procedure, and   extremity.     An incision was then made 2 cm distal and lateral to the   anterior superior iliac spine extending over the orientation of the   tensor fascia lata muscle and sharp dissection was carried down to the   fascia of the muscle and protractor placed in the soft tissues.      The fascia was then incised.  The muscle belly was identified and swept  laterally and retractor placed along the superior neck.  Following   cauterization of the circumflex vessels and removing some pericapsular   fat, a second cobra retractor was placed on the inferior neck.  A third   retractor was placed on the anterior acetabulum after elevating the   anterior rectus.  A L-capsulotomy was along the line of the   superior neck to the trochanteric fossa, then extended proximally and   distally.  Tag sutures were placed and the retractors were then placed   intracapsular.  We then identified the trochanteric fossa and   orientation of my neck cut, confirmed this radiographically   and then made a neck osteotomy with the femur on traction.  The femoral   head was removed without difficulty or complication.  Traction was let   off and retractors were placed posterior and anterior around the   acetabulum.      The labrum and foveal tissue were debrided.  I began reaming with a 43mm   reamer and reamed up to 80mm reamer with good bony bed preparation and a 82mm   cup was chosen.  The final 3mm Pinnacle cup was then impacted under fluoroscopy   to confirm the depth of penetration and orientation with respect to   abduction.  A screw was placed followed by the hole eliminator.  The final   36+4 neutral Altrex liner was impacted with good visualized rim fit.  The cup was positioned anatomically within the acetabular portion of the pelvis.      At this point, the femur was rolled at 80 degrees.  Further capsule was   released off the inferior aspect of the femoral neck.  I then   released the superior capsule proximally.  The hook was placed laterally   along the femur and elevated manually and held in position with the bed   hook.  The leg was then extended and adducted with the leg rolled to 100   degrees of external rotation.  Once the proximal femur was fully   exposed, I used a box osteotome to set orientation.  I then began   broaching with the starting chili pepper broach and passed this by hand and then broached up to 7 to match the other hip previously replaced.  With the 7 broach in place I chose a high offset neck and did a trial reduction.  The offset was appropriate, leg lengths   appeared to be equal, confirmed radiographically.   Given these findings, I went ahead and dislocated the hip, repositioned all   retractors and positioned the right hip in the extended and abducted position.  The final 7 Hi Tri Lock stem was   chosen and it was impacted down to the level of neck cut.  Based on this   and the trial reduction, a 36+1.5 delta ceramic ball was chosen and   impacted onto a clean and dry trunnion, and the hip was reduced.  The   hip had been irrigated throughout the case again at this point.  I did   reapproximate the superior capsular leaflet to the anterior leaflet   using #1 Vicryl.  The fascia of the   tensor fascia lata muscle was then reapproximated using #1 Vicryl and #0 V-lock sutures.  The   remaining wound was closed with 2-0 Vicryl and running 4-0 Monocryl.   The hip was cleaned, dried, and dressed  sterilely using Dermabond and   Aquacel dressing.  She was  then brought   to recovery room in stable condition tolerating the procedure well.    Nehemiah Massed, PA-C was present for the entirety of the case involved from   preoperative positioning, perioperative retractor management, general   facilitation of the case, as well as primary wound closure as assistant.            Pietro Cassis Alvan Dame, M.D.        08/04/2014 12:39 PM

## 2014-08-04 NOTE — Transfer of Care (Signed)
Immediate Anesthesia Transfer of Care Note  Patient: Kristin Byrd  Procedure(s) Performed: Procedure(s): LEFT TOTAL HIP ARTHROPLASTY ANTERIOR APPROACH (Left)  Patient Location: PACU  Anesthesia Type:General  Level of Consciousness: awake, alert  and oriented  Airway & Oxygen Therapy: Patient Spontanous Breathing and Patient connected to face mask oxygen  Post-op Assessment: Report given to PACU RN and Post -op Vital signs reviewed and stable  Post vital signs: Reviewed and stable  Complications: No apparent anesthesia complications

## 2014-08-04 NOTE — Interval H&P Note (Signed)
History and Physical Interval Note:  08/04/2014 9:35 AM  Kristin Byrd  has presented today for surgery, with the diagnosis of LEFT HIP OA  The various methods of treatment have been discussed with the patient and family. After consideration of risks, benefits and other options for treatment, the patient has consented to  Procedure(s): LEFT TOTAL HIP ARTHROPLASTY ANTERIOR APPROACH (Left) as a surgical intervention .  The patient's history has been reviewed, patient examined, no change in status, stable for surgery.  I have reviewed the patient's chart and labs.  Questions were answered to the patient's satisfaction.     Mauri Pole

## 2014-08-04 NOTE — Anesthesia Postprocedure Evaluation (Signed)
  Anesthesia Post-op Note  Patient: Kristin Byrd  Procedure(s) Performed: Procedure(s) (LRB): LEFT TOTAL HIP ARTHROPLASTY ANTERIOR APPROACH (Left)  Patient Location: PACU  Anesthesia Type: General  Level of Consciousness: awake and alert   Airway and Oxygen Therapy: Patient Spontanous Breathing  Post-op Pain: mild  Post-op Assessment: Post-op Vital signs reviewed, Patient's Cardiovascular Status Stable, Respiratory Function Stable, Patent Airway and No signs of Nausea or vomiting  Last Vitals:  Filed Vitals:   08/04/14 1345  BP:   Pulse: 94  Temp:   Resp: 11    Post-op Vital Signs: stable   Complications: No apparent anesthesia complications

## 2014-08-04 NOTE — Anesthesia Preprocedure Evaluation (Addendum)
Anesthesia Evaluation  Patient identified by MRN, date of birth, ID band Patient awake    Reviewed: Allergy & Precautions, H&P , NPO status , Patient's Chart, lab work & pertinent test results  Airway Mallampati: II  TM Distance: >3 FB Neck ROM: Full    Dental no notable dental hx.    Pulmonary neg pulmonary ROS,  breath sounds clear to auscultation  Pulmonary exam normal       Cardiovascular hypertension, Pt. on medications Rhythm:Regular Rate:Normal     Neuro/Psych negative neurological ROS  negative psych ROS   GI/Hepatic negative GI ROS, Neg liver ROS,   Endo/Other  diabetes  Renal/GU negative Renal ROS  negative genitourinary   Musculoskeletal negative musculoskeletal ROS (+)   Abdominal   Peds negative pediatric ROS (+)  Hematology negative hematology ROS (+)   Anesthesia Other Findings   Reproductive/Obstetrics negative OB ROS                           Anesthesia Physical Anesthesia Plan  ASA: II  Anesthesia Plan: General   Post-op Pain Management:    Induction: Intravenous  Airway Management Planned: Oral ETT  Additional Equipment:   Intra-op Plan:   Post-operative Plan: Extubation in OR  Informed Consent: I have reviewed the patients History and Physical, chart, labs and discussed the procedure including the risks, benefits and alternatives for the proposed anesthesia with the patient or authorized representative who has indicated his/her understanding and acceptance.   Dental advisory given  Plan Discussed with: CRNA and Surgeon  Anesthesia Plan Comments:         Anesthesia Quick Evaluation

## 2014-08-04 NOTE — Plan of Care (Signed)
Problem: Consults Goal: Total Joint Replacement Patient Education See Patient Education Module for education specifics. Outcome: Progressing Goal: Diagnosis- Total Joint Replacement Primary Total Hip Goal: Skin Care Protocol Initiated - if Braden Score 18 or less If consults are not indicated, leave blank or document N/A Outcome: Not Applicable Date Met:  56/15/37 Goal: Nutrition Consult-if indicated Outcome: Not Applicable Date Met:  94/32/76 Goal: Diabetes Guidelines if Diabetic/Glucose > 140 If diabetic or lab glucose is > 140 mg/dl - Initiate Diabetes/Hyperglycemia Guidelines & Document Interventions  Outcome: Not Applicable Date Met:  14/70/92  Problem: Phase I Progression Outcomes Goal: CMS/Neurovascular status WDL Outcome: Progressing Goal: Pain controlled with appropriate interventions Outcome: Progressing

## 2014-08-05 ENCOUNTER — Encounter (HOSPITAL_COMMUNITY): Payer: Self-pay | Admitting: Orthopedic Surgery

## 2014-08-05 DIAGNOSIS — E669 Obesity, unspecified: Secondary | ICD-10-CM | POA: Diagnosis present

## 2014-08-05 LAB — CBC
HCT: 31.5 % — ABNORMAL LOW (ref 36.0–46.0)
Hemoglobin: 11 g/dL — ABNORMAL LOW (ref 12.0–15.0)
MCH: 31.6 pg (ref 26.0–34.0)
MCHC: 34.9 g/dL (ref 30.0–36.0)
MCV: 90.5 fL (ref 78.0–100.0)
Platelets: 241 10*3/uL (ref 150–400)
RBC: 3.48 MIL/uL — ABNORMAL LOW (ref 3.87–5.11)
RDW: 11.8 % (ref 11.5–15.5)
WBC: 11.9 10*3/uL — ABNORMAL HIGH (ref 4.0–10.5)

## 2014-08-05 LAB — BASIC METABOLIC PANEL
Anion gap: 11 (ref 5–15)
BUN: 10 mg/dL (ref 6–23)
CO2: 26 mEq/L (ref 19–32)
Calcium: 8.9 mg/dL (ref 8.4–10.5)
Chloride: 97 mEq/L (ref 96–112)
Creatinine, Ser: 0.69 mg/dL (ref 0.50–1.10)
GFR, EST NON AFRICAN AMERICAN: 89 mL/min — AB (ref >90)
Glucose, Bld: 133 mg/dL — ABNORMAL HIGH (ref 70–99)
POTASSIUM: 4.5 meq/L (ref 3.7–5.3)
Sodium: 134 mEq/L — ABNORMAL LOW (ref 137–147)

## 2014-08-05 LAB — GLUCOSE, CAPILLARY
GLUCOSE-CAPILLARY: 113 mg/dL — AB (ref 70–99)
Glucose-Capillary: 160 mg/dL — ABNORMAL HIGH (ref 70–99)

## 2014-08-05 MED ORDER — HYDROCODONE-ACETAMINOPHEN 7.5-325 MG PO TABS: 1.0000 | ORAL_TABLET | ORAL | Status: DC | PRN

## 2014-08-05 MED ORDER — METHOCARBAMOL 500 MG PO TABS: 500.0000 mg | ORAL_TABLET | Freq: Four times a day (QID) | ORAL | Status: DC | PRN

## 2014-08-05 MED ORDER — FERROUS SULFATE 325 (65 FE) MG PO TABS
325.0000 mg | ORAL_TABLET | Freq: Three times a day (TID) | ORAL | Status: DC
Start: 2014-08-05 — End: 2019-09-18

## 2014-08-05 MED ORDER — DSS 100 MG PO CAPS: 100.0000 mg | ORAL_CAPSULE | Freq: Two times a day (BID) | ORAL | Status: DC

## 2014-08-05 MED ORDER — ASPIRIN 325 MG PO TBEC: 325.0000 mg | DELAYED_RELEASE_TABLET | Freq: Two times a day (BID) | ORAL | Status: AC

## 2014-08-05 MED ORDER — POLYETHYLENE GLYCOL 3350 17 G PO PACK: 17.0000 g | PACK | Freq: Two times a day (BID) | ORAL | Status: DC

## 2014-08-05 NOTE — Care Management Note (Signed)
    Page 1 of 2   08/05/2014     1:12:29 PM CARE MANAGEMENT NOTE 08/05/2014  Patient:  Kristin Byrd, Kristin Byrd   Account Number:  [id number]  Date Initiated:  08/05/2014  Documentation initiated by:  [facility]  Subjective/Objective Assessment:   adm: LEFT TOTAL HIP ARTHROPLASTY ANTERIOR APPROACH (Left)     Action/Plan:   discharge planning   Anticipated DC Date:  08/05/2014   Anticipated DC Plan:  [place]  CM consult      [facility] Choice  HOME HEALTH   Choice offered to / List presented to:  C-1 Patient   DME arranged  NA      DME agency  NA     [place] arranged  HH-2 PT      [place].   Status of service:  Completed, signed off Medicare Important Message given?   (If response is "NO", the following Medicare IM given date fields will be blank) Date Medicare IM given:   Medicare IM given by:   Date Additional Medicare IM given:   Additional Medicare IM given by:    Discharge Disposition:  [place]  Per UR Regulation:  Reviewed for med. necessity/level of care/duration of stay  If discussed at [place] of Stay Meetings, dates discussed:    Comments:  08/05/14 13:05 Cm met with pt in room to offer choice for home health agency.  Pt chooses [facility] to render HHPT; unfortunately, [facility] does not have the staffing in pt's area to render service.  Cm asked pt's second choice which is [facility].  Address ande contact information verified with pt. Referral called to [facility] rep, Kristen.  NO DME is needed bc pt has recently had similar surgery.  No other CM needs were communicated.  Mariane Masters, BSN, CM [phone].

## 2014-08-05 NOTE — Progress Notes (Signed)
Physical Therapy Treatment Patient Details Name: Kristin Byrd MRN: [id number] DOB: 1951/08/04 Today's Date: 10-Aug-2014    History of Present Illness      PT Comments    Progressing well and eager for d/c home.  Reviewed car transfers and stairs.  Home therex and stair climing handout provided.  Follow Up Recommendations  Home health PT     Equipment Recommendations  None recommended by PT    Recommendations for Other Services OT consult     Precautions / Restrictions Precautions Precautions: Fall Restrictions Weight Bearing Restrictions: No Other Position/Activity Restrictions: WBAT    Mobility  Bed Mobility                  Transfers Overall transfer level: Needs assistance Equipment used: Rolling walker (2 wheeled) Transfers: Sit to/from Stand Sit to Stand: Min guard;Supervision         General transfer comment: cues for LE managment and use of UEs to self assist  Ambulation/Gait Ambulation/Gait assistance: Min guard;Supervision Ambulation Distance (Feet): 100 Feet (and 20' back from restroom) Assistive device: Rolling walker (2 wheeled) Gait Pattern/deviations: Step-to pattern;Decreased step length - right;Decreased step length - left;Shuffle;Trunk flexed Gait velocity: decr   General Gait Details: min cues for sequence, posture and position from RW   Stairs Stairs: Yes Stairs assistance: Min assist Stair Management: One rail Right;Step to pattern;Forwards;With cane Number of Stairs: 5 General stair comments: cues for sequence and foot/cane placement  Wheelchair Mobility    Modified Rankin (Stroke Patients Only)       Balance                                    Cognition Arousal/Alertness: Awake/alert Behavior During Therapy: WFL for tasks assessed/performed Overall Cognitive Status: Within Functional Limits for tasks assessed                      Exercises      General Comments         Pertinent Vitals/Pain Pain Assessment: 0-10 Pain Score: 3  Pain Location: L hip/thigh Pain Descriptors / Indicators: Aching;Burning Pain Intervention(s): Limited activity within patient's tolerance;Monitored during session;Premedicated before session;Ice applied    Home Living                      Prior Function            PT Goals (current goals can now be found in the care plan section) Acute Rehab PT Goals Patient Stated Goal: Resume previous lifestyle with decreased pain PT Goal Formulation: With patient Time For Goal Achievement: 08/08/14 Potential to Achieve Goals: Good Progress towards PT goals: Progressing toward goals    Frequency  7X/week    PT Plan Current plan remains appropriate    Co-evaluation             End of Session   Activity Tolerance: Patient tolerated treatment well Patient left: in chair;with call bell/phone within reach     Time: 3845-3646 PT Time Calculation (min) (ACUTE ONLY): 38 min  Charges:  $Gait Training: 23-37 mins $Therapeutic Activity: 8-22 mins                    G Codes:      [place] 2014-08-10, 5:38 PM

## 2014-08-05 NOTE — Progress Notes (Signed)
OT Cancellation Note  Patient Details Name: Kristin Byrd MRN: [id number] DOB: Aug 11, 1951   Cancelled Treatment:    Reason Eval/Treat Not Completed: OT screened, no needs identified, will sign off.  Pt has a h/o having THA on opposite side.  She has DME, assistance and verbalizes comfort with transfers and ADLs.  Nobel Brar 08/05/2014, 3:54 PM  Lesle Chris, OTR/L [phone] 08/05/2014

## 2014-08-05 NOTE — Progress Notes (Signed)
     Subjective: 1 Day Post-Op Procedure(s) (LRB): LEFT TOTAL HIP ARTHROPLASTY ANTERIOR APPROACH (Left)   Patient reports pain as mild, pain controlled. No events throughout the night. Looking forward to working with PT.  Ready to be discharged home.  Objective:   VITALS:   Filed Vitals:   08/05/14 0637  BP: 129/63  Pulse: 73  Temp: 98.1 F (36.7 C)  Resp: 17    Dorsiflexion/Plantar flexion intact Incision: dressing C/D/I No cellulitis present Compartment soft  LABS  Recent Labs  08/05/14 0454  HGB 11.0*  HCT 31.5*  WBC 11.9*  PLT 241     Recent Labs  08/05/14 0454  NA 134*  K 4.5  BUN 10  CREATININE 0.69  GLUCOSE 133*     Assessment/Plan: 1 Day Post-Op Procedure(s) (LRB): LEFT TOTAL HIP ARTHROPLASTY ANTERIOR APPROACH (Left) Foley cath d/c'ed Advance diet Up with therapy D/C IV fluids Discharge home with home health  Follow up in 2 weeks at [facility]. Follow up with OLIN,Mikele Sifuentes D in 2 weeks.  Contact information:  [facility] [address] [place] [phone]    Obese (BMI 30-39.9) Estimated body mass index is 32.62 kg/(m^2) as calculated from the following:   Height as of this encounter: 5\' 9"  (1.753 m).   Weight as of this encounter: 100.245 kg (221 lb). Patient also counseled that weight may inhibit the healing process Patient counseled that losing weight will help with future health issues        West Pugh. Deletha Jaffee   PAC  08/05/2014, 9:53 AM

## 2014-08-05 NOTE — Plan of Care (Signed)
Problem: Phase III Progression Outcomes Goal: Pain controlled on oral analgesia Outcome: Progressing Goal: Ambulates Outcome: Progressing Goal: Incision clean - minimal/no drainage Outcome: Completed/Met Date Met:  08/05/14

## 2014-08-05 NOTE — Plan of Care (Signed)
Problem: Consults Goal: Total Joint Replacement Patient Education See Patient Education Module for education specifics.  Outcome: Completed/Met Date Met:  08/05/14 Goal: Diagnosis- Total Joint Replacement Outcome: Completed/Met Date Met:  08/05/14 Primary Total Hip  LEFT, Anterior  Problem: Phase I Progression Outcomes Goal: CMS/Neurovascular status WDL Outcome: Completed/Met Date Met:  08/05/14 Goal: Pain controlled with appropriate interventions Outcome: Completed/Met Date Met:  08/05/14 Goal: Dangle or out of bed evening of surgery Outcome: Completed/Met Date Met:  08/05/14 Goal: Initial discharge plan identified Outcome: Completed/Met Date Met:  08/05/14 Goal: Hemodynamically stable Outcome: Completed/Met Date Met:  08/05/14 Goal: Other Phase I Outcomes/Goals Outcome: Not Applicable Date Met:  36/14/43  Problem: Phase II Progression Outcomes Goal: Tolerating diet Outcome: Completed/Met Date Met:  08/05/14 Goal: Discharge plan established Outcome: Completed/Met Date Met:  08/05/14 Goal: Other Phase II Outcomes/Goals Outcome: Not Applicable Date Met:  15/40/08

## 2014-08-05 NOTE — Evaluation (Signed)
Physical Therapy Evaluation Patient Details Name: Kristin Byrd MRN: [id number] DOB: October 13, 1950 Today's Date: 08/05/2014   History of Present Illness     Clinical Impression  Pt s/p L THR presents with decreased L LE strength/ROM and post op pain limiting functional mobility.  Pt should progress to d/c home with family assist and HHPT follow up.    Follow Up Recommendations Home health PT    Equipment Recommendations  None recommended by PT    Recommendations for Other Services OT consult     Precautions / Restrictions Precautions Precautions: Fall Restrictions Weight Bearing Restrictions: No Other Position/Activity Restrictions: WBAT      Mobility  Bed Mobility Overal bed mobility: Needs Assistance Bed Mobility: Supine to Sit     Supine to sit: Min assist;Mod assist     General bed mobility comments: cues for sequence and use of R LE to self assist.  PHysical assist to manage L LE and to bring trunk to upright  Transfers Overall transfer level: Needs assistance Equipment used: Rolling walker (2 wheeled) Transfers: Sit to/from Stand Sit to Stand: Min assist         General transfer comment: cues for LE managment and use of UEs to self assist  Ambulation/Gait Ambulation/Gait assistance: Min assist Ambulation Distance (Feet): 32 Feet Assistive device: Rolling walker (2 wheeled) Gait Pattern/deviations: Step-to pattern;Decreased step length - right;Decreased step length - left;Shuffle;Trunk flexed Gait velocity: decr   General Gait Details: cues for sequence, posture and position from [employer]            Wheelchair Mobility    Modified Rankin (Stroke Patients Only)       Balance                                             Pertinent Vitals/Pain Pain Assessment: 0-10 Pain Score: 4  Pain Location: L hip/thigh Pain Descriptors / Indicators: Aching;Burning Pain Intervention(s): Limited activity within patient's  tolerance;Monitored during session;Premedicated before session;Ice applied    Home Living Family/patient expects to be discharged to:: Private residence Living Arrangements: Spouse/significant other Available Help at Discharge: Family Type of Home: House Home Access: Stairs to enter Entrance Stairs-Rails: Right Entrance Stairs-Number of Steps: 4 Home Layout: One level Home Equipment: Environmental consultant - 2 wheels;Cane - single point      Prior Function Level of Independence: Independent;Independent with assistive device(s)               Hand Dominance   Dominant Hand: Right    Extremity/Trunk Assessment   Upper Extremity Assessment: Overall WFL for tasks assessed           Lower Extremity Assessment: LLE deficits/detail   LLE Deficits / Details: 2+/5 hip strength with AAROM at hip to 75 flex and 15 abd  Cervical / Trunk Assessment: Normal  Communication   Communication: No difficulties  Cognition Arousal/Alertness: Awake/alert Behavior During Therapy: WFL for tasks assessed/performed Overall Cognitive Status: Within Functional Limits for tasks assessed                      General Comments      Exercises Total Joint Exercises Ankle Circles/Pumps: AROM;Both;Supine;15 reps Quad Sets: AROM;Both;10 reps;Supine Heel Slides: AAROM;Left;15 reps;Supine Hip ABduction/ADduction: AAROM;Left;10 reps;Supine      Assessment/Plan    PT Assessment Patient needs continued PT services  PT Diagnosis Difficulty  walking   PT Problem List Decreased strength;Decreased range of motion;Decreased activity tolerance;Decreased mobility;Decreased knowledge of use of DME;Pain  PT Treatment Interventions DME instruction;Gait training;Stair training;Functional mobility training;Therapeutic activities;Therapeutic exercise;Patient/family education   PT Goals (Current goals can be found in the Care Plan section) Acute Rehab PT Goals Patient Stated Goal: Resume previous lifestyle with  decreased pain PT Goal Formulation: With patient Time For Goal Achievement: 08/08/14 Potential to Achieve Goals: Good    Frequency 7X/week   Barriers to discharge        Co-evaluation               End of Session Equipment Utilized During Treatment: Gait belt Activity Tolerance: Patient tolerated treatment well;Patient limited by fatigue Patient left: in chair;with call bell/phone within reach Nurse Communication: Mobility status         Time: 0810-0855 PT Time Calculation (min) (ACUTE ONLY): 45 min   Charges:   PT Evaluation $Initial PT Evaluation Tier I: 1 Procedure PT Treatments $Gait Training: 8-22 mins $Therapeutic Exercise: 8-22 mins   PT G Codes:          Kristin Byrd 08/05/2014, 11:32 AM

## 2014-08-05 NOTE — Plan of Care (Signed)
Problem: Phase II Progression Outcomes Goal: Ambulates Outcome: Progressing

## 2014-08-07 ENCOUNTER — Encounter (HOSPITAL_COMMUNITY): Payer: Self-pay | Admitting: Orthopedic Surgery

## 2014-08-10 NOTE — Discharge Summary (Signed)
Physician Discharge Summary  Patient ID: RYHANNA DUNSMORE MRN: [id number] DOB/AGE: July 16, 1951 63 y.o.  Admit date: 08/04/2014 Discharge date: 08/05/2014   Procedures:  Procedure(s) (LRB): LEFT TOTAL HIP ARTHROPLASTY ANTERIOR APPROACH (Left)  Attending Physician:  Dr. Paralee Cancel   Admission Diagnoses:   Left hip primary OA / pain  Discharge Diagnoses:  Principal Problem:   S/P left THA, AA Active Problems:   Obese  Past Medical History  Diagnosis Date  . PONV (postoperative nausea and vomiting)   . Hypertension   . Arthritis   . Diabetes mellitus     HPI: Kristin Byrd, 63 y.o. female, has a history of pain and functional disability in the left hip(s) due to arthritis and patient has failed non-surgical conservative treatments for greater than 12 weeks to include NSAID's and/or analgesics, supervised PT with diminished ADL's post treatment and activity modification. Onset of symptoms was gradual starting 2+ years ago with gradually worsening course since that time.The patient noted prior procedures of the hip to include arthroplasty on the right hip per Dr. Alvan Dame on 08/15/2011. Patient currently rates pain in the bilaterally hip at 8 out of 10 with activity. Patient has worsening of pain with activity and weight bearing, trendelenberg gait, pain that interfers with activities of daily living and pain with passive range of motion. Patient has evidence of periarticular osteophytes and joint space narrowing by imaging studies. This condition presents safety issues increasing the risk of falls. There is no current active infection. Risks, benefits and expectations were discussed with the patient. Risks including but not limited to the risk of anesthesia, blood clots, nerve damage, blood vessel damage, failure of the prosthesis, infection and up to and including death. Patient understand the risks, benefits and expectations and wishes to proceed with surgery.    PCP:  Myrlene Broker, MD   Discharged Condition: good  Hospital Course:  Patient underwent the above stated procedure on 08/04/2014. Patient tolerated the procedure well and brought to the recovery room in good condition and subsequently to the floor.  POD #1 BP: 129/63 ; Pulse: 73 ; Temp: 98.1 F (36.7 C) ; Resp: 17 Patient reports pain as mild, pain controlled. No events throughout the night. Looking forward to working with PT. Ready to be discharged home. Dorsiflexion/plantar flexion intact, incision: dressing C/D/I, no cellulitis present and compartment soft.   LABS  Basename    HGB  11.0  HCT  31.5    Discharge Exam: General appearance: alert, cooperative and no distress Extremities: Homans sign is negative, no sign of DVT, no edema, redness or tenderness in the calves or thighs and no ulcers, gangrene or trophic changes  Disposition: Home with follow up in 2 weeks   Follow-up Information    Follow up with [place].   Why:  3n1 (commode)   Contact information:   [address] [place] [address] [phone]       Follow up with Mauri Pole, MD. Schedule an appointment as soon as possible for a visit in 2 weeks.   Specialty:  Orthopedic Surgery   Contact information:   [address] [place] [address] [phone]       Follow up with [facility].   Why:  home health physical therapy   Contact information:   [address] [place] [address] [phone]       Discharge Instructions    Call MD / Call 911  Complete by:  As directed   If you experience chest pain or shortness of breath, CALL 911 and be transported to the hospital emergency room.  If you develope a fever above 101 F, pus (white drainage) or increased drainage or redness at the wound, or calf pain, call your surgeon's office.     Change dressing    Complete by:  As directed   Maintain surgical dressing for 10-14  days, or until follow up in the clinic.     Constipation Prevention    Complete by:  As directed   Drink plenty of fluids.  Prune juice may be helpful.  You may use a stool softener, such as Colace (over the counter) 100 mg twice a day.  Use MiraLax (over the counter) for constipation as needed.     Diet - low sodium heart healthy    Complete by:  As directed      Discharge instructions    Complete by:  As directed   Maintain surgical dressing for 10-14 days, or until follow up in the clinic. Follow up in 2 weeks at [facility]. Call with any questions or concerns.     Increase activity slowly as tolerated    Complete by:  As directed      TED hose    Complete by:  As directed   Use stockings (TED hose) for 2 weeks on both leg(s).  You may remove them at night for sleeping.     Weight bearing as tolerated    Complete by:  As directed   Laterality:  left  Extremity:  Lower             Medication List    STOP taking these medications        ibuprofen 200 MG tablet  Commonly known as:  ADVIL,MOTRIN     naproxen sodium 220 MG tablet  Commonly known as:  ANAPROX     traMADol 50 MG tablet  Commonly known as:  ULTRAM      TAKE these medications        amLODipine 10 MG tablet  Commonly known as:  NORVASC  Take 10 mg by mouth every morning.     amLODipine-benazepril 5-20 MG per capsule  Commonly known as:  LOTREL  Take 1 capsule by mouth 2 (two) times daily.     aspirin 325 MG EC tablet  Take 1 tablet (325 mg total) by mouth 2 (two) times daily.     atorvastatin 10 MG tablet  Commonly known as:  LIPITOR  Take 10 mg by mouth daily. Patient takes in am     cetirizine 10 MG tablet  Commonly known as:  ZYRTEC  Take 10 mg by mouth daily.     DSS 100 MG Caps  Take 100 mg by mouth 2 (two) times daily.  Notes to Patient:  Colace ( stool softner)     ferrous sulfate 325 (65 FE) MG tablet  Take 1 tablet (325 mg total) by mouth 3 (three) times daily after  meals.     Fish Oil 1000 MG Cpdr  Take 2,000 mg by mouth 2 (two) times daily.     HYDROcodone-acetaminophen 7.5-325 MG per tablet  Commonly known as:  NORCO  Take 1-2 tablets by mouth every 4 (four) hours as needed for moderate pain.     metFORMIN 500 MG tablet  Commonly known as:  GLUCOPHAGE  Take 500 mg by mouth 2 (two) times daily with a meal.  methocarbamol 500 MG tablet  Commonly known as:  ROBAXIN  Take 1 tablet (500 mg total) by mouth every 6 (six) hours as needed for muscle spasms.     mometasone 50 MCG/ACT nasal spray  Commonly known as:  NASONEX  Place 2 sprays into the nose every morning.     montelukast 10 MG tablet  Commonly known as:  SINGULAIR  Take 10 mg by mouth every morning.     polyethylene glycol packet  Commonly known as:  MIRALAX / GLYCOLAX  Take 17 g by mouth 2 (two) times daily.     REFRESH 1 % ophthalmic solution  Generic drug:  carboxymethylcellulose  Apply 1 drop to eye daily.     valsartan-hydrochlorothiazide 320-25 MG per tablet  Commonly known as:  DIOVAN-HCT  Take 1 tablet by mouth every morning.     Vitamin D 2000 UNITS tablet  Take 2,000 Units by mouth every morning.         Signed: [place] Pugh. Lolita Faulds   PA-C  08/10/2014, 3:35 PM

## 2015-07-23 ENCOUNTER — Other Ambulatory Visit: Payer: Self-pay

## 2015-07-23 DIAGNOSIS — Z1231 Encounter for screening mammogram for malignant neoplasm of breast: Secondary | ICD-10-CM

## 2015-08-30 ENCOUNTER — Ambulatory Visit
Admission: RE | Admit: 2015-08-30 | Discharge: 2015-08-30 | Disposition: A | Discharge status: Home / Self Care | Payer: Managed Care, Other (non HMO) | Source: Ambulatory Visit

## 2015-08-30 DIAGNOSIS — Z1231 Encounter for screening mammogram for malignant neoplasm of breast: Secondary | ICD-10-CM

## 2015-10-05 IMAGING — CR DG CHEST 2V
2 series · 2 of 2 positions shown · non-contrast
Comparison: None.

CLINICAL DATA: Preop for left hip surgery.

EXAM:
CHEST  2 VIEW

[w chest pa]
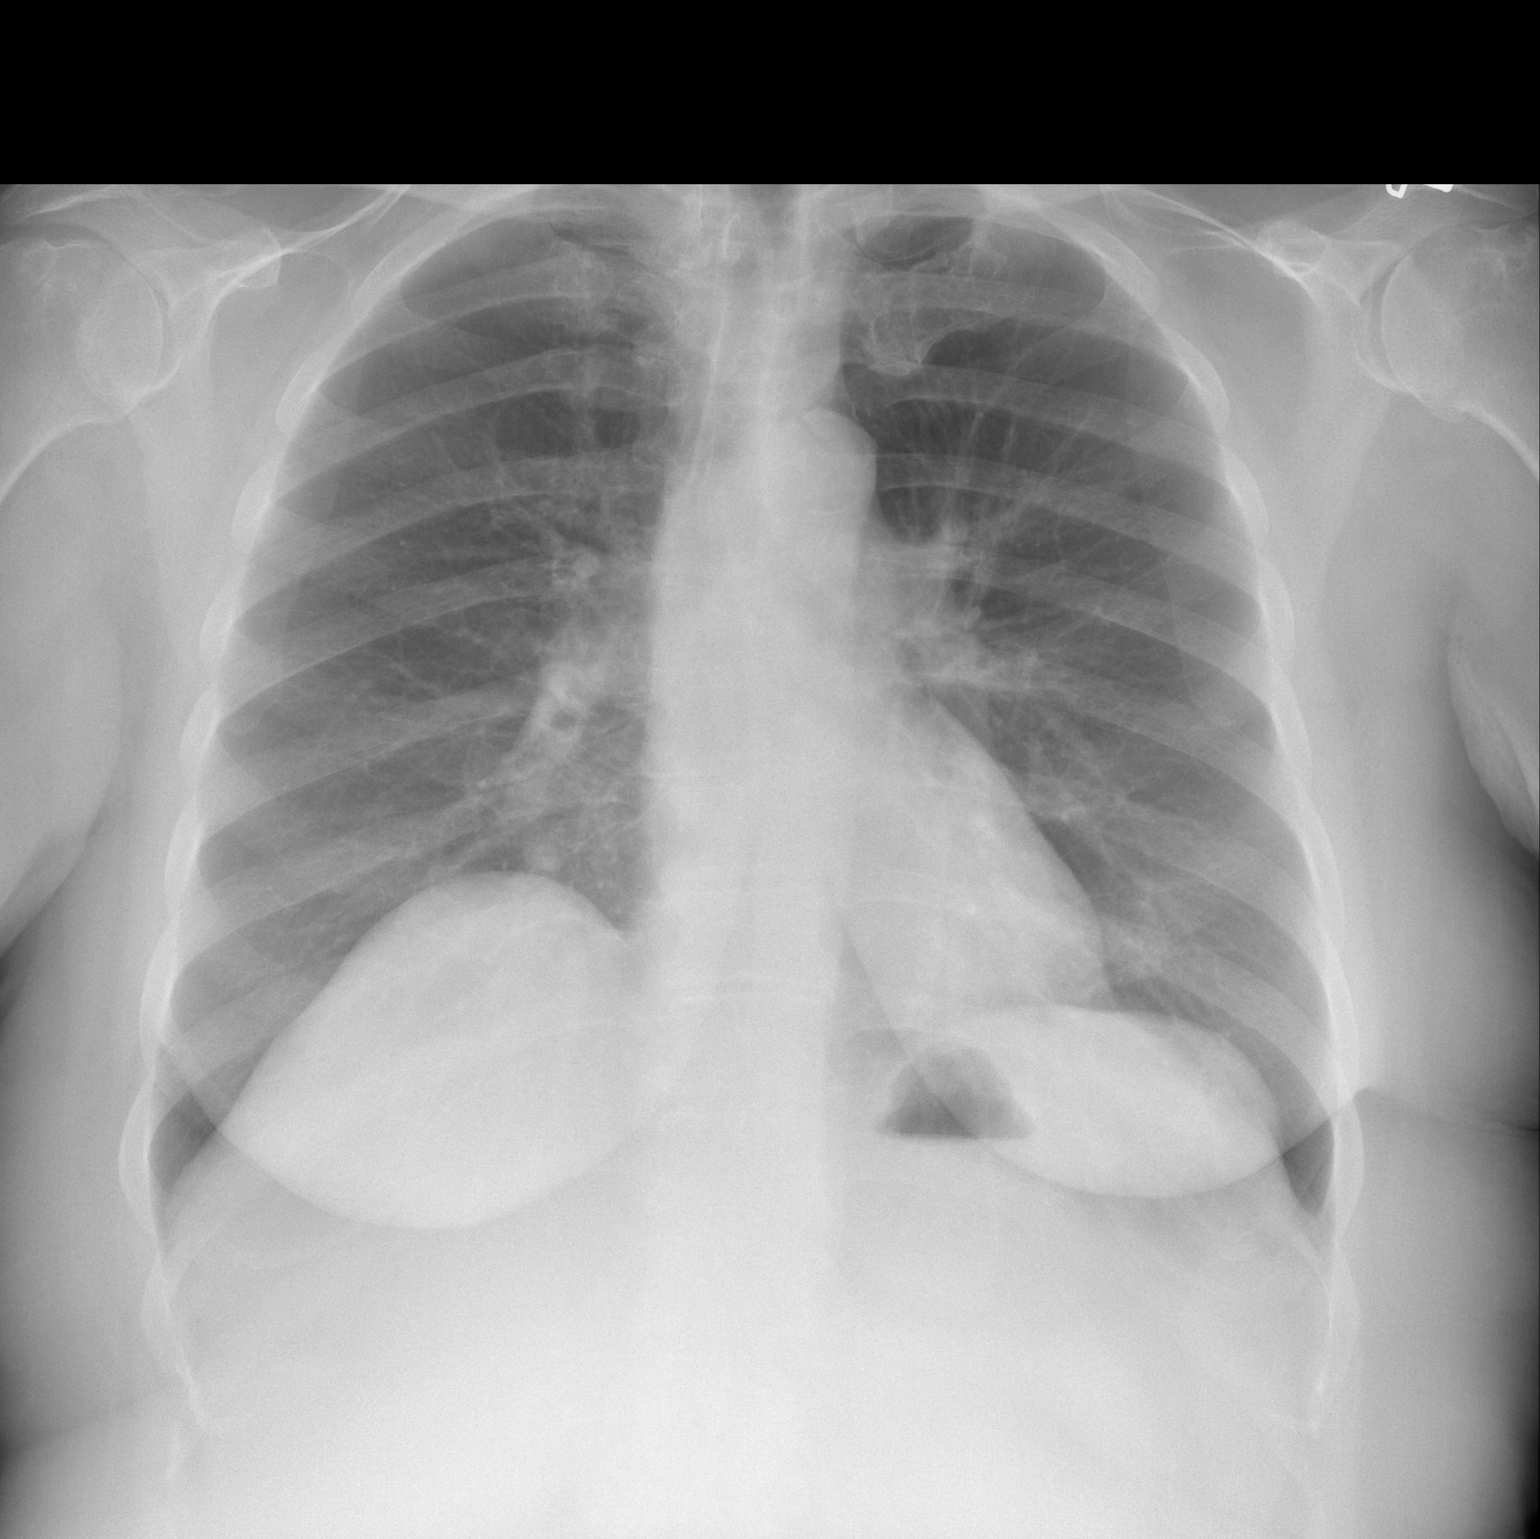

[w chest lat]
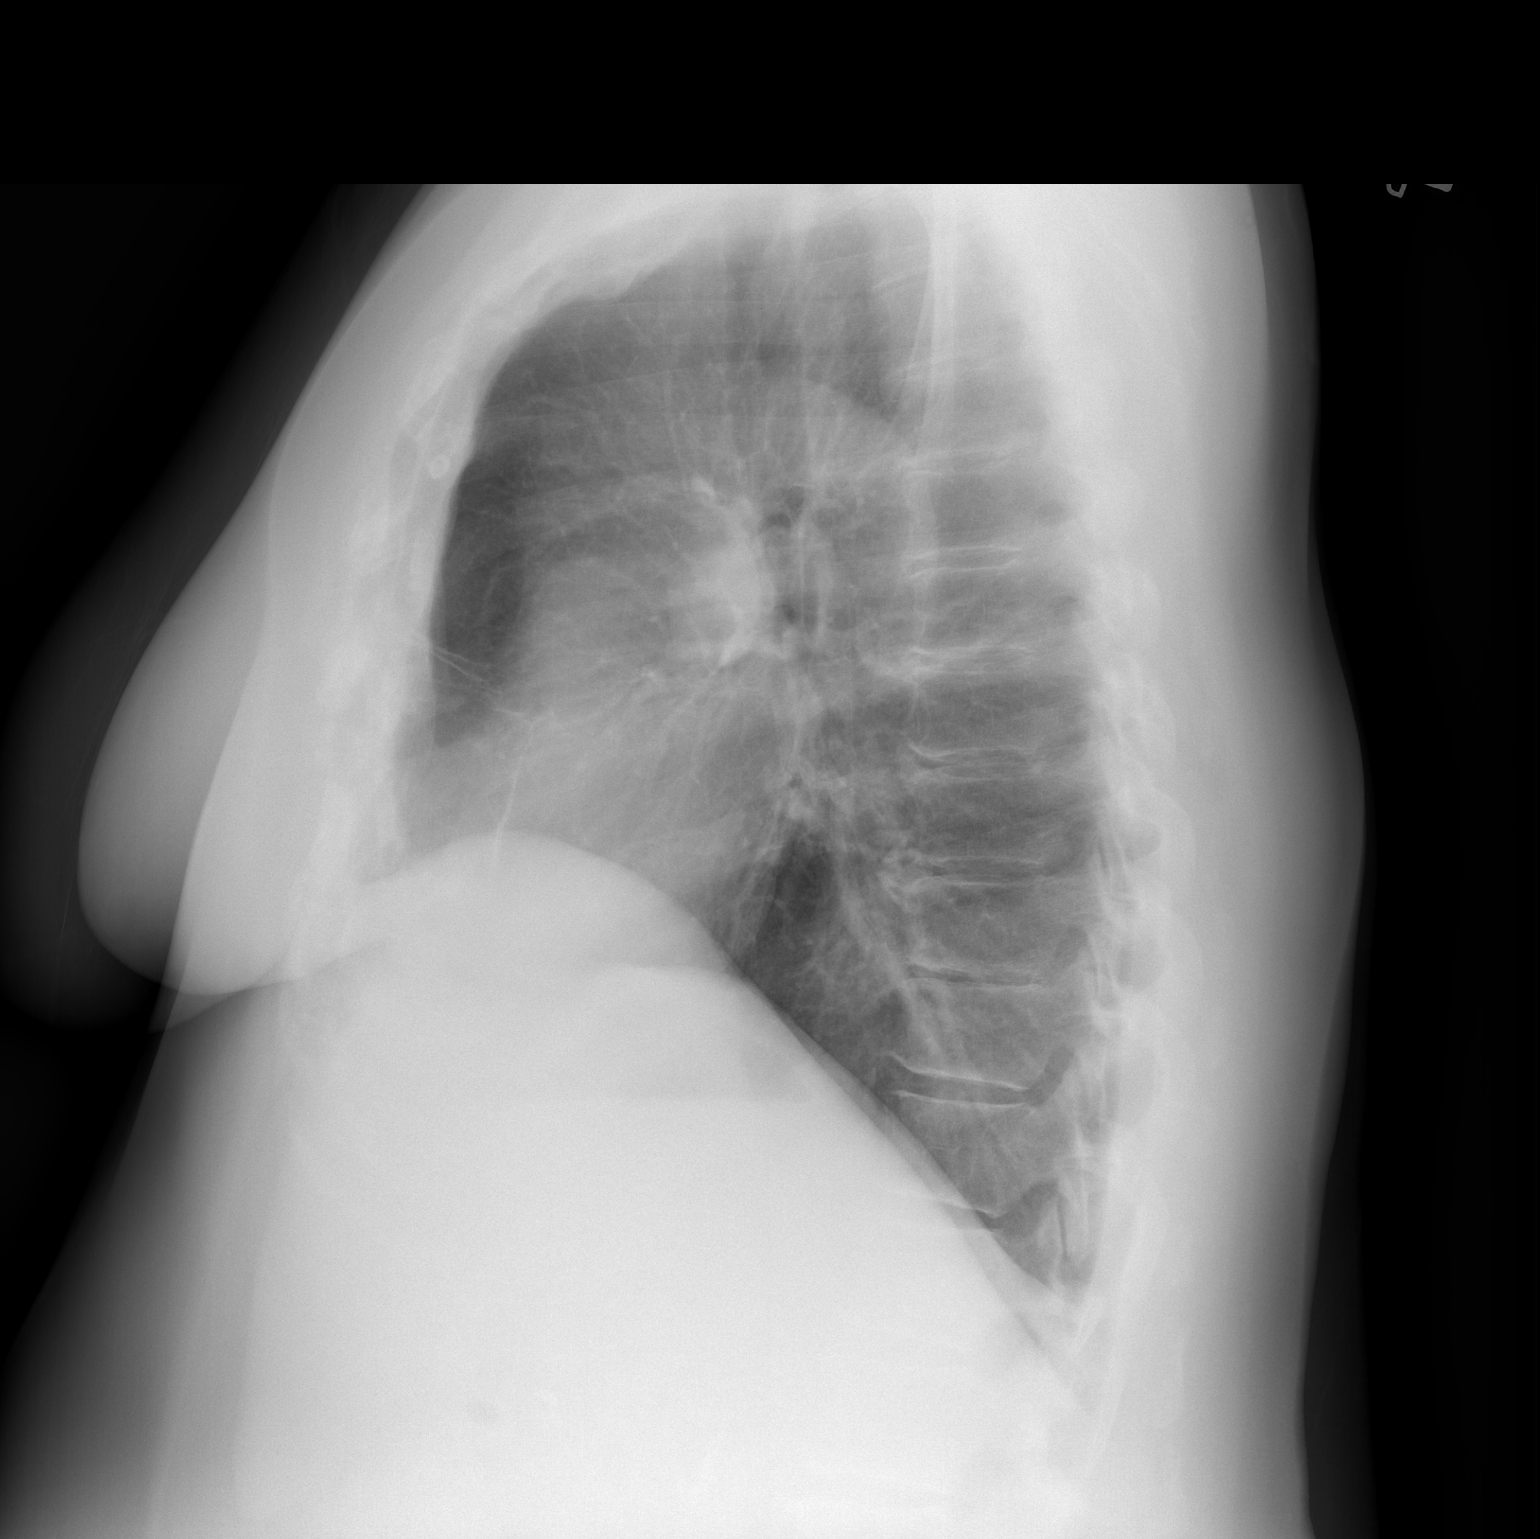

[2 of 2 positions shown; findings below may reference images not displayed]

FINDINGS: The heart size and mediastinal contours are within normal limits.
Both lungs are clear. The visualized skeletal structures are
unremarkable.
IMPRESSION: No active cardiopulmonary disease.

## 2015-10-12 IMAGING — CR DG PORTABLE PELVIS
1 series · 1 of 1 positions shown · non-contrast
Comparison: 08/15/2011

CLINICAL DATA: Postop left hip replacement.

EXAM:
PORTABLE PELVIS 1-2 VIEWS

[AP]
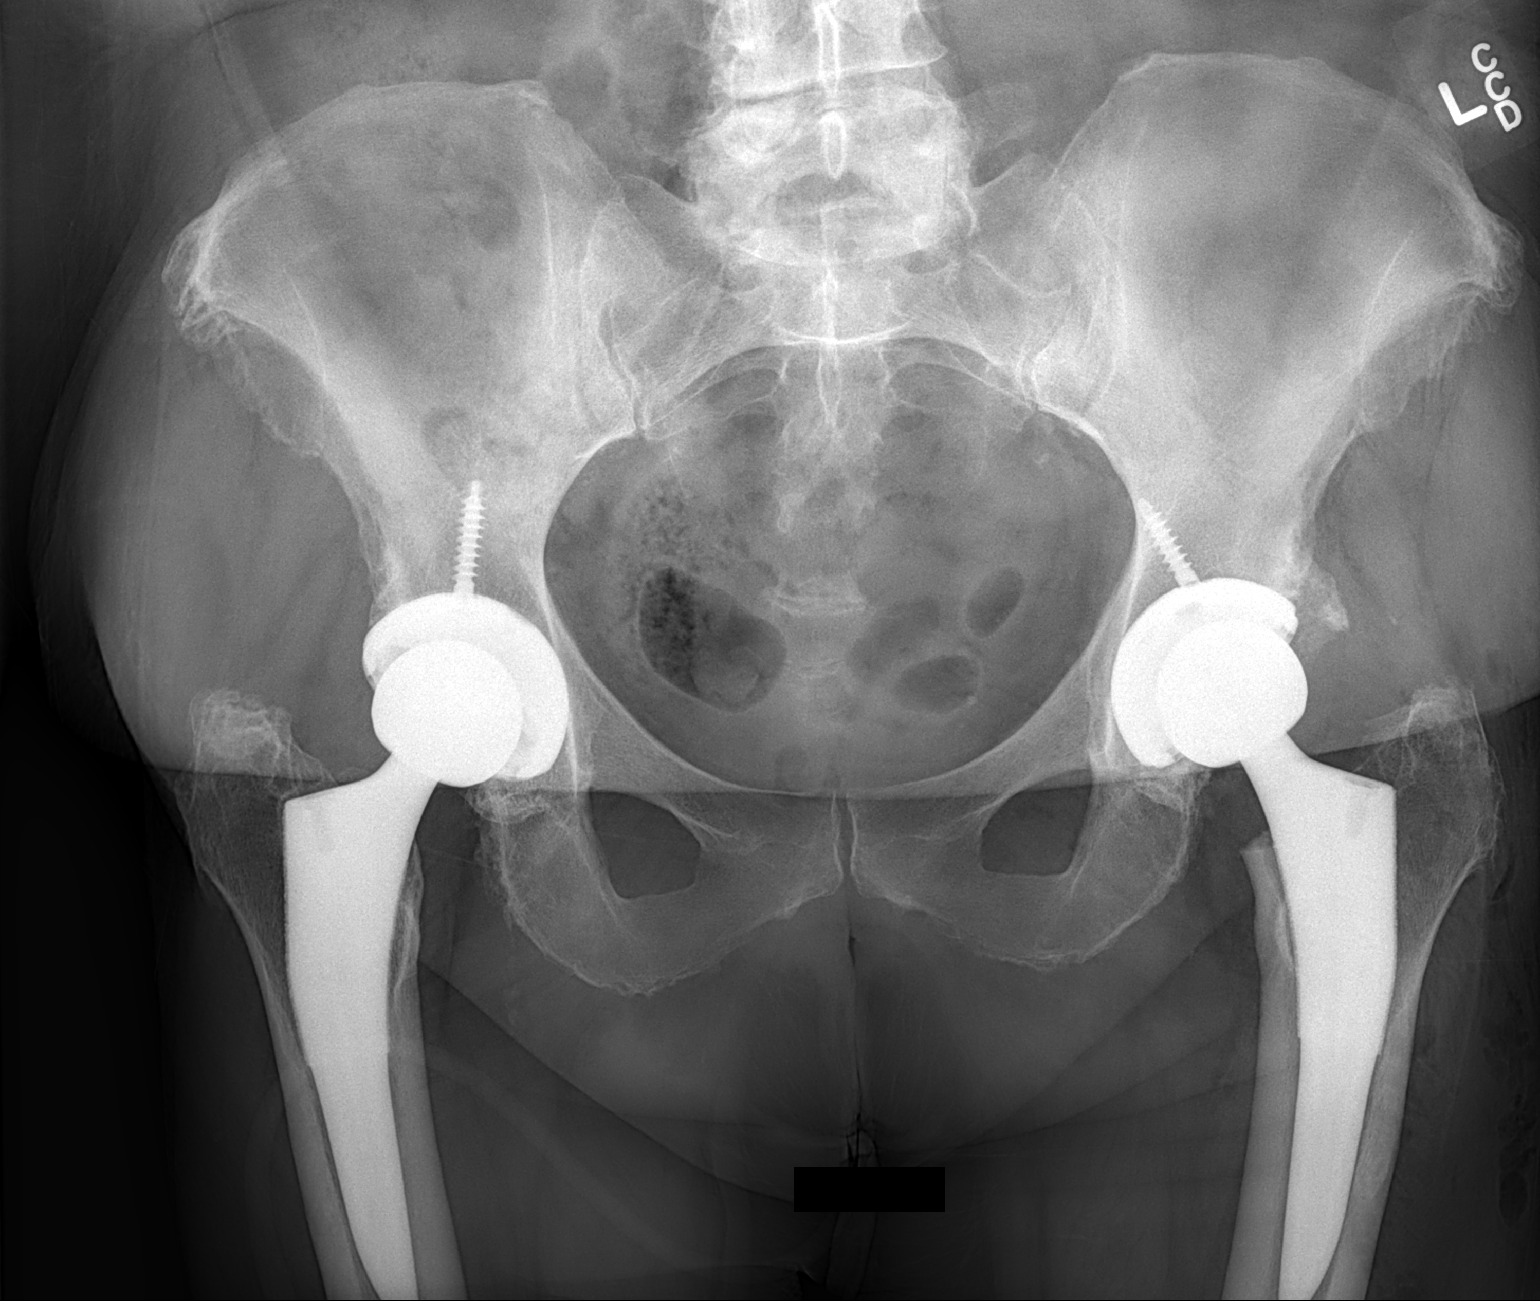

[1 of 1 positions shown; findings below may reference images not displayed]

FINDINGS: Examination demonstrates no change in patient's right total hip
arthroplasty. There is a left total hip arthroplasty which appears
intact and normally located. There are degenerative changes of the
spine.
IMPRESSION: Left total hip arthroplasty without complicating features.

## 2019-01-02 DIAGNOSIS — J302 Other seasonal allergic rhinitis: Secondary | ICD-10-CM | POA: Diagnosis not present

## 2019-01-02 DIAGNOSIS — E119 Type 2 diabetes mellitus without complications: Secondary | ICD-10-CM | POA: Diagnosis not present

## 2019-01-02 DIAGNOSIS — Z9181 History of falling: Secondary | ICD-10-CM | POA: Diagnosis not present

## 2019-01-02 DIAGNOSIS — E669 Obesity, unspecified: Secondary | ICD-10-CM | POA: Diagnosis not present

## 2019-01-02 DIAGNOSIS — Z6831 Body mass index (BMI) 31.0-31.9, adult: Secondary | ICD-10-CM | POA: Diagnosis not present

## 2019-01-02 DIAGNOSIS — I10 Essential (primary) hypertension: Secondary | ICD-10-CM | POA: Diagnosis not present

## 2019-08-06 DIAGNOSIS — E119 Type 2 diabetes mellitus without complications: Secondary | ICD-10-CM | POA: Diagnosis not present

## 2019-08-06 DIAGNOSIS — Z79899 Other long term (current) drug therapy: Secondary | ICD-10-CM | POA: Diagnosis not present

## 2019-08-06 DIAGNOSIS — E782 Mixed hyperlipidemia: Secondary | ICD-10-CM | POA: Diagnosis not present

## 2019-08-13 DIAGNOSIS — E8881 Metabolic syndrome: Secondary | ICD-10-CM | POA: Diagnosis not present

## 2019-08-13 DIAGNOSIS — J3489 Other specified disorders of nose and nasal sinuses: Secondary | ICD-10-CM | POA: Diagnosis not present

## 2019-08-13 DIAGNOSIS — R0989 Other specified symptoms and signs involving the circulatory and respiratory systems: Secondary | ICD-10-CM | POA: Diagnosis not present

## 2019-08-13 DIAGNOSIS — E119 Type 2 diabetes mellitus without complications: Secondary | ICD-10-CM | POA: Diagnosis not present

## 2019-08-13 DIAGNOSIS — E669 Obesity, unspecified: Secondary | ICD-10-CM | POA: Diagnosis not present

## 2019-08-13 DIAGNOSIS — R160 Hepatomegaly, not elsewhere classified: Secondary | ICD-10-CM | POA: Diagnosis not present

## 2019-08-13 DIAGNOSIS — I10 Essential (primary) hypertension: Secondary | ICD-10-CM | POA: Diagnosis not present

## 2019-08-13 DIAGNOSIS — Z0000 Encounter for general adult medical examination without abnormal findings: Secondary | ICD-10-CM | POA: Diagnosis not present

## 2019-08-13 DIAGNOSIS — Z86010 Personal history of colonic polyps: Secondary | ICD-10-CM | POA: Diagnosis not present

## 2019-08-13 DIAGNOSIS — J302 Other seasonal allergic rhinitis: Secondary | ICD-10-CM | POA: Diagnosis not present

## 2019-08-13 DIAGNOSIS — E782 Mixed hyperlipidemia: Secondary | ICD-10-CM | POA: Diagnosis not present

## 2019-08-22 DIAGNOSIS — R160 Hepatomegaly, not elsewhere classified: Secondary | ICD-10-CM | POA: Diagnosis not present

## 2019-08-22 DIAGNOSIS — R0989 Other specified symptoms and signs involving the circulatory and respiratory systems: Secondary | ICD-10-CM | POA: Diagnosis not present

## 2019-08-22 DIAGNOSIS — I6523 Occlusion and stenosis of bilateral carotid arteries: Secondary | ICD-10-CM | POA: Diagnosis not present

## 2019-09-09 ENCOUNTER — Other Ambulatory Visit: Payer: Self-pay | Admitting: *Deleted

## 2019-09-09 ENCOUNTER — Telehealth (HOSPITAL_COMMUNITY): Payer: Self-pay | Admitting: *Deleted

## 2019-09-09 DIAGNOSIS — I779 Disorder of arteries and arterioles, unspecified: Secondary | ICD-10-CM

## 2019-09-09 NOTE — Telephone Encounter (Signed)
The above patient or their representative was contacted and gave the following answers to these questions:         Do you have any of the following symptoms?    NO  Fever                    Cough                   Shortness of breath  Do  you have any of the following other symptoms?    muscle pain         vomiting,        diarrhea        rash         weakness        red eye        abdominal pain         bruising          bruising or bleeding              joint pain           severe headache    Have you been in contact with someone who was or has been sick in the past 2 weeks?  NO  Yes                 Unsure                         Unable to assess   Does the person that you were in contact with have any of the following symptoms?   Cough         shortness of breath           muscle pain         vomiting,            diarrhea            rash            weakness           fever            red eye           abdominal pain           bruising  or  bleeding                joint pain                severe headache                 COMMENTS OR ACTION PLAN FOR THIS PATIENT:    09/09/19  No per patient

## 2019-09-10 ENCOUNTER — Other Ambulatory Visit: Payer: Self-pay

## 2019-09-10 ENCOUNTER — Ambulatory Visit (INDEPENDENT_AMBULATORY_CARE_PROVIDER_SITE_OTHER): Payer: PPO | Admitting: Vascular Surgery

## 2019-09-10 ENCOUNTER — Ambulatory Visit (HOSPITAL_COMMUNITY)
Admission: RE | Admit: 2019-09-10 | Discharge: 2019-09-10 | Disposition: A | Discharge status: Home / Self Care | Payer: PPO | Source: Ambulatory Visit | Attending: Vascular Surgery | Admitting: Vascular Surgery

## 2019-09-10 ENCOUNTER — Encounter: Payer: Self-pay | Admitting: Vascular Surgery

## 2019-09-10 VITALS — BP 146/74 | HR 100 | Temp 97.7°F | Resp 18 | Ht 69.0 in | Wt 212.1 lb

## 2019-09-10 DIAGNOSIS — I6523 Occlusion and stenosis of bilateral carotid arteries: Secondary | ICD-10-CM

## 2019-09-10 DIAGNOSIS — I779 Disorder of arteries and arterioles, unspecified: Secondary | ICD-10-CM | POA: Diagnosis not present

## 2019-09-10 NOTE — Progress Notes (Signed)
Referring Physician: Clydie Braun, PA  Patient name: Kristin Byrd MRN: [id number] DOB: 09/05/1951 Sex: female  REASON FOR CONSULT: Asymptomatic high-grade right internal carotid artery stenosis  HPI: Kristin Byrd is a 68 y.o. female, who was recently noted to have a carotid bruit on physical exam.  She was sent for carotid duplex exam which noted high-grade right internal carotid artery stenosis.  The patient has no symptoms of TIA amaurosis or stroke.  She has been on 81 mg of aspirin for several years after her husband had a stroke.  She denies any prior tobacco use.  She has never had a stroke. She is on Lipitor.  Other medical problems include diabetes hypertension arthritis all of which are currently stable.  Past Medical History:  Diagnosis Date  . Arthritis   . Diabetes mellitus   . Hypertension   . PONV (postoperative nausea and vomiting)    Past Surgical History:  Procedure Laterality Date  . CHOLECYSTECTOMY    . DENTAL SURGERY    . DILATION AND CURETTAGE OF UTERUS    . L WIRST     CYST REMOVED  . TOTAL HIP ARTHROPLASTY  08/15/2011   Procedure: TOTAL HIP ARTHROPLASTY ANTERIOR APPROACH;  Surgeon: Mauri Pole;  Location: [facility];  Service: Orthopedics;  Laterality: Right;  . TOTAL HIP ARTHROPLASTY Left 08/04/2014   Procedure: LEFT TOTAL HIP ARTHROPLASTY ANTERIOR APPROACH;  Surgeon: Mauri Pole, MD;  Location: [facility];  Service: Orthopedics;  Laterality: Left;    Family History  Problem Relation Age of Onset  . Stroke Father     SOCIAL HISTORY: Social History   Socioeconomic History  . Marital status: Married    Spouse name: Not on file  . Number of children: Not on file  . Years of education: Not on file  . Highest education level: Not on file  Occupational History  . Not on file  Tobacco Use  . Smoking status: Never Smoker  . Smokeless tobacco: Never Used  Substance and Sexual Activity  . Alcohol use: Yes    Comment: rarely  . Drug  use: No  . Sexual activity: Yes  Other Topics Concern  . Not on file  Social History Narrative  . Not on file   Social Determinants of Health   Financial Resource Strain:   . Difficulty of Paying Living Expenses: Not on file  Food Insecurity:   . Worried About Charity fundraiser in the Last Year: Not on file  . Ran Out of Food in the Last Year: Not on file  Transportation Needs:   . Lack of Transportation (Medical): Not on file  . Lack of Transportation (Non-Medical): Not on file  Physical Activity:   . Days of Exercise per Week: Not on file  . Minutes of Exercise per Session: Not on file  Stress:   . Feeling of Stress : Not on file  Social Connections:   . Frequency of Communication with Friends and Family: Not on file  . Frequency of Social Gatherings with Friends and Family: Not on file  . Attends Religious Services: Not on file  . Active Member of Clubs or Organizations: Not on file  . Attends Archivist Meetings: Not on file  . Marital Status: Not on file  Intimate Partner Violence:   . Fear of Current or Ex-Partner: Not on file  . Emotionally Abused: Not on file  . Physically Abused: Not on file  . Sexually Abused: Not  on file    Allergies  Allergen Reactions  . Hydrocodone Itching  . Oxycodone Itching  . Shellfish Allergy Nausea And Vomiting    Current Outpatient Medications  Medication Sig Dispense Refill  . amLODipine (NORVASC) 10 MG tablet Take 10 mg by mouth every morning.    Marland Kitchen atorvastatin (LIPITOR) 10 MG tablet Take 10 mg by mouth daily. Patient takes in am    . carboxymethylcellulose (REFRESH) 1 % ophthalmic solution Apply 1 drop to eye daily.      . cetirizine (ZYRTEC) 10 MG tablet Take 10 mg by mouth daily.      . Cholecalciferol (VITAMIN D) 2000 UNITS tablet Take 2,000 Units by mouth every morning.     . docusate sodium 100 MG CAPS Take 100 mg by mouth 2 (two) times daily. 10 capsule 0  . ferrous sulfate 325 (65 FE) MG tablet Take 1  tablet (325 mg total) by mouth 3 (three) times daily after meals.  3  . HYDROcodone-acetaminophen (NORCO) 7.5-325 MG per tablet Take 1-2 tablets by mouth every 4 (four) hours as needed for moderate pain. 100 tablet 0  . losartan-hydrochlorothiazide (HYZAAR) 100-25 MG tablet Take 1 tablet by mouth daily.    . metFORMIN (GLUCOPHAGE) 500 MG tablet Take 1,000 mg by mouth 2 (two) times daily with a meal.     . mometasone (NASONEX) 50 MCG/ACT nasal spray Place 2 sprays into the nose every morning.      . montelukast (SINGULAIR) 10 MG tablet Take 10 mg by mouth every morning.     Marland Kitchen amLODipine-benazepril (LOTREL) 5-20 MG per capsule Take 1 capsule by mouth 2 (two) times daily.     . methocarbamol (ROBAXIN) 500 MG tablet Take 1 tablet (500 mg total) by mouth every 6 (six) hours as needed for muscle spasms. (Patient not taking: Reported on 09/10/2019) 50 tablet 0  . Omega-3 Fatty Acids (FISH OIL) 1000 MG CPDR Take 2,000 mg by mouth 2 (two) times daily.      . polyethylene glycol (MIRALAX / GLYCOLAX) packet Take 17 g by mouth 2 (two) times daily. (Patient not taking: Reported on 09/10/2019) 14 each 0  . valsartan-hydrochlorothiazide (DIOVAN-HCT) 320-25 MG per tablet Take 1 tablet by mouth every morning.     No current facility-administered medications for this visit.    ROS:   General:  No weight loss, Fever, chills  HEENT: No recent headaches, no nasal bleeding, no visual changes, no sore throat  Neurologic: No dizziness, blackouts, seizures. No recent symptoms of stroke or mini- stroke. No recent episodes of slurred speech, or temporary blindness.  Cardiac: No recent episodes of chest pain/pressure, no shortness of breath at rest.  No shortness of breath with exertion.  Denies history of atrial fibrillation or irregular heartbeat  Vascular: No history of rest pain in feet.  No history of claudication.  No history of non-healing ulcer, No history of DVT   Pulmonary: No home oxygen, no productive  cough, no hemoptysis,  No asthma or wheezing  Musculoskeletal:  [X]  Arthritis, [ ]  Low back pain,  [X]  Joint pain  Hematologic:No history of hypercoagulable state.  No history of easy bleeding.  No history of anemia  Gastrointestinal: No hematochezia or melena,  No gastroesophageal reflux, no trouble swallowing  Urinary: [ ]  chronic Kidney disease, [ ]  on HD - [ ]  MWF or [ ]  TTHS, [ ]  Burning with urination, [ ]  Frequent urination, [ ]  Difficulty urinating;   Skin: No rashes  Psychological: No  history of anxiety,  No history of depression   Physical Examination  Vitals:   09/10/19 1121  BP: (!) 146/74  Pulse: 100  Resp: 18  Temp: 97.7 F (36.5 C)  TempSrc: Temporal  SpO2: 98%  Weight: 212 lb 1.6 oz (96.2 kg)  Height: 5\' 9"  (1.753 m)    Body mass index is 31.32 kg/m.  General:  Alert and oriented, no acute distress HEENT: Normal Neck: No JVD Pulmonary: Clear to auscultation bilaterally Cardiac: Regular Rate and Rhythm  Skin: No rash Extremity Pulses:  2+ radial, brachial, dorsalis pedis, posterior tibial pulses bilaterally Musculoskeletal: No deformity or edema  Neurologic: Upper and lower extremity motor 5/5 and symmetric  DATA:  Patient had a repeat carotid duplex exam in our office today which showed greater than 80% right internal carotid artery stenosis 40 to 60% left internal carotid artery stenosis abnormal right vertebral artery flow normal left vertebral artery flow  ASSESSMENT: Greater than 80% right internal carotid artery stenosis currently asymptomatic   PLAN: Patient will continue her aspirin and statin.  She will have a CT angio of the head and neck to evaluate whether or not she would be a better candidate for a TCAR carotid stenting versus carotid endarterectomy.  Both procedures risk benefits possible complications were discussed with the patient today.  These include but are not limited to bleeding infection cranial nerve injury stroke risk 1 to  2%.  We will schedule her for carotid endarterectomy or carotid stenting based on the CTA findings.   Ruta Hinds, MD [facility] Office: [phone] Pager: [phone]

## 2019-09-17 ENCOUNTER — Other Ambulatory Visit: Payer: Self-pay

## 2019-09-17 ENCOUNTER — Ambulatory Visit (HOSPITAL_COMMUNITY)
Admission: RE | Admit: 2019-09-17 | Discharge: 2019-09-17 | Disposition: A | Discharge status: Home / Self Care | Payer: PPO | Source: Ambulatory Visit | Attending: Vascular Surgery | Admitting: Vascular Surgery

## 2019-09-17 DIAGNOSIS — I6523 Occlusion and stenosis of bilateral carotid arteries: Secondary | ICD-10-CM | POA: Insufficient documentation

## 2019-09-17 LAB — POCT I-STAT CREATININE: Creatinine, Ser: 0.7 mg/dL (ref 0.44–1.00)

## 2019-09-17 MED ORDER — IOHEXOL 350 MG/ML SOLN
75.0000 mL | Freq: Once | INTRAVENOUS | Status: AC | PRN
  Administered 2019-09-17: 75 mL via INTRAVENOUS

## 2019-09-18 ENCOUNTER — Ambulatory Visit (INDEPENDENT_AMBULATORY_CARE_PROVIDER_SITE_OTHER): Payer: PPO | Admitting: Vascular Surgery

## 2019-09-18 ENCOUNTER — Encounter: Payer: Self-pay | Admitting: Vascular Surgery

## 2019-09-18 VITALS — BP 147/81 | HR 100 | Temp 97.5°F | Resp 20 | Ht 69.0 in | Wt 212.0 lb

## 2019-09-18 DIAGNOSIS — I6521 Occlusion and stenosis of right carotid artery: Secondary | ICD-10-CM

## 2019-09-18 NOTE — H&P (View-Only) (Signed)
Patient name: Kristin Byrd MRN: [id number] DOB: [birth year] Sex: female   HPI: Kristin Byrd is a 69 y.o. female, who returns for follow-up today after CT angio of the head and neck.  She has had no symptoms of TIA amaurosis or stroke.  She is on aspirin and a statin.  Other medical problems include arthritis diabetes and hypertension.  Past Medical History:  Diagnosis Date  . Arthritis   . Diabetes mellitus   . Hypertension   . PONV (postoperative nausea and vomiting)    Past Surgical History:  Procedure Laterality Date  . CHOLECYSTECTOMY    . DENTAL SURGERY    . DILATION AND CURETTAGE OF UTERUS    . L WIRST     CYST REMOVED  . TOTAL HIP ARTHROPLASTY  08/15/2011   Procedure: TOTAL HIP ARTHROPLASTY ANTERIOR APPROACH;  Surgeon: Mauri Pole;  Location: [facility];  Service: Orthopedics;  Laterality: Right;  . TOTAL HIP ARTHROPLASTY Left 08/04/2014   Procedure: LEFT TOTAL HIP ARTHROPLASTY ANTERIOR APPROACH;  Surgeon: Mauri Pole, MD;  Location: [facility];  Service: Orthopedics;  Laterality: Left;    Family History  Problem Relation Age of Onset  . Stroke Father     SOCIAL HISTORY: Social History   Socioeconomic History  . Marital status: Married    Spouse name: Not on file  . Number of children: Not on file  . Years of education: Not on file  . Highest education level: Not on file  Occupational History  . Not on file  Tobacco Use  . Smoking status: Never Smoker  . Smokeless tobacco: Never Used  Substance and Sexual Activity  . Alcohol use: Yes    Comment: rarely  . Drug use: No  . Sexual activity: Yes  Other Topics Concern  . Not on file  Social History Narrative  . Not on file   Social Determinants of Health   Financial Resource Strain:   . Difficulty of Paying Living Expenses: Not on file  Food Insecurity:   . Worried About Charity fundraiser in the Last Year: Not on file  . Ran Out of Food in the Last Year: Not on file  Transportation  Needs:   . Lack of Transportation (Medical): Not on file  . Lack of Transportation (Non-Medical): Not on file  Physical Activity:   . Days of Exercise per Week: Not on file  . Minutes of Exercise per Session: Not on file  Stress:   . Feeling of Stress : Not on file  Social Connections:   . Frequency of Communication with Friends and Family: Not on file  . Frequency of Social Gatherings with Friends and Family: Not on file  . Attends Religious Services: Not on file  . Active Member of Clubs or Organizations: Not on file  . Attends Archivist Meetings: Not on file  . Marital Status: Not on file  Intimate Partner Violence:   . Fear of Current or Ex-Partner: Not on file  . Emotionally Abused: Not on file  . Physically Abused: Not on file  . Sexually Abused: Not on file    Allergies  Allergen Reactions  . Hydrocodone Itching  . Oxycodone Itching  . Shellfish Allergy Nausea And Vomiting  . Sulfamethoxazole Nausea Only    Current Outpatient Medications  Medication Sig Dispense Refill  . amLODipine (NORVASC) 10 MG tablet Take 10 mg by mouth every morning.    Marland Kitchen aspirin 81 MG EC tablet Take  by mouth.    Marland Kitchen atorvastatin (LIPITOR) 10 MG tablet Take 10 mg by mouth daily. Patient takes in am    . carboxymethylcellulose (REFRESH) 1 % ophthalmic solution Apply 1 drop to eye daily.      . cetirizine (ZYRTEC) 10 MG tablet Take 10 mg by mouth daily.      . Cholecalciferol (VITAMIN D) 2000 UNITS tablet Take 2,000 Units by mouth every morning.     Marland Kitchen JANUVIA 100 MG tablet Take 100 mg by mouth daily.    Marland Kitchen losartan-hydrochlorothiazide (HYZAAR) 50-12.5 MG tablet Take 2 tablets by mouth daily.    . metFORMIN (GLUCOPHAGE) 1000 MG tablet     . mometasone (NASONEX) 50 MCG/ACT nasal spray Place 2 sprays into the nose every morning.      . montelukast (SINGULAIR) 10 MG tablet Take 10 mg by mouth every morning.      No current facility-administered medications for this visit.    ROS:    General:  No weight loss, Fever, chills  HEENT: No recent headaches, no nasal bleeding, no visual changes, no sore throat  Neurologic: No dizziness, blackouts, seizures. No recent symptoms of stroke or mini- stroke. No recent episodes of slurred speech, or temporary blindness.  Cardiac: No recent episodes of chest pain/pressure, no shortness of breath at rest.  No shortness of breath with exertion.  Denies history of atrial fibrillation or irregular heartbeat  Vascular: No history of rest pain in feet.  No history of claudication.  No history of non-healing ulcer, No history of DVT   Pulmonary: No home oxygen, no productive cough, no hemoptysis,  No asthma or wheezing  Musculoskeletal:  [ ]  Arthritis, [ ]  Low back pain,  [ ]  Joint pain  Hematologic:No history of hypercoagulable state.  No history of easy bleeding.  No history of anemia  Gastrointestinal: No hematochezia or melena,  No gastroesophageal reflux, no trouble swallowing  Urinary: [ ]  chronic Kidney disease, [ ]  on HD - [ ]  MWF or [ ]  TTHS, [ ]  Burning with urination, [ ]  Frequent urination, [ ]  Difficulty urinating;   Skin: No rashes  Psychological: No history of anxiety,  No history of depression   Physical Examination  Vitals:   09/18/19 0931 09/18/19 0934  BP: (!) 141/79 (!) 147/81  Pulse: 100   Resp: 20   Temp: (!) 97.5 F (36.4 C)   SpO2: 99%   Weight: 212 lb (96.2 kg)   Height: 5\' 9"  (1.753 m)     Body mass index is 31.31 kg/m.  General:  Alert and oriented, no acute distress HEENT: Normal Neck: No JVD Cardiac: Regular Rate and Rhythm  Skin: No rash Extremity Pulses:  2+ radial, brachial pulses bilaterally Musculoskeletal: No deformity or edema  Neurologic: Upper and lower extremity motor 5/5 and symmetric  DATA:  CT angio of the head and neck shows 30% left internal carotid artery stenosis subtotal occlusion of the right internal carotid artery and occlusion of the right vertebral  artery  ASSESSMENT: Asymptomatic subtotal occlusion right internal carotid artery   PLAN: Right carotid endarterectomy scheduled for September 30, 2019.  Risk benefits possible complications and procedure details including but not limited to bleeding infection cranial nerve injury stroke risk were discussed with the patient today.  She understands and agrees to proceed.   Of note she states she has been slow to wake up anesthesia in the past.  I told her to discuss this with the anesthesia team.  Ruta Hinds,  MD [facility] Office: [phone] Pager: [phone]

## 2019-09-18 NOTE — Progress Notes (Signed)
Patient name: Kristin Byrd MRN: [id number] DOB: [birth year] Sex: female   HPI: Kristin Byrd is a 69 y.o. female, who returns for follow-up today after CT angio of the head and neck.  She has had no symptoms of TIA amaurosis or stroke.  She is on aspirin and a statin.  Other medical problems include arthritis diabetes and hypertension.  Past Medical History:  Diagnosis Date  . Arthritis   . Diabetes mellitus   . Hypertension   . PONV (postoperative nausea and vomiting)    Past Surgical History:  Procedure Laterality Date  . CHOLECYSTECTOMY    . DENTAL SURGERY    . DILATION AND CURETTAGE OF UTERUS    . L WIRST     CYST REMOVED  . TOTAL HIP ARTHROPLASTY  08/15/2011   Procedure: TOTAL HIP ARTHROPLASTY ANTERIOR APPROACH;  Surgeon: Mauri Pole;  Location: [facility];  Service: Orthopedics;  Laterality: Right;  . TOTAL HIP ARTHROPLASTY Left 08/04/2014   Procedure: LEFT TOTAL HIP ARTHROPLASTY ANTERIOR APPROACH;  Surgeon: Mauri Pole, MD;  Location: [facility];  Service: Orthopedics;  Laterality: Left;    Family History  Problem Relation Age of Onset  . Stroke Father     SOCIAL HISTORY: Social History   Socioeconomic History  . Marital status: Married    Spouse name: Not on file  . Number of children: Not on file  . Years of education: Not on file  . Highest education level: Not on file  Occupational History  . Not on file  Tobacco Use  . Smoking status: Never Smoker  . Smokeless tobacco: Never Used  Substance and Sexual Activity  . Alcohol use: Yes    Comment: rarely  . Drug use: No  . Sexual activity: Yes  Other Topics Concern  . Not on file  Social History Narrative  . Not on file   Social Determinants of Health   Financial Resource Strain:   . Difficulty of Paying Living Expenses: Not on file  Food Insecurity:   . Worried About Charity fundraiser in the Last Year: Not on file  . Ran Out of Food in the Last Year: Not on file  Transportation  Needs:   . Lack of Transportation (Medical): Not on file  . Lack of Transportation (Non-Medical): Not on file  Physical Activity:   . Days of Exercise per Week: Not on file  . Minutes of Exercise per Session: Not on file  Stress:   . Feeling of Stress : Not on file  Social Connections:   . Frequency of Communication with Friends and Family: Not on file  . Frequency of Social Gatherings with Friends and Family: Not on file  . Attends Religious Services: Not on file  . Active Member of Clubs or Organizations: Not on file  . Attends Archivist Meetings: Not on file  . Marital Status: Not on file  Intimate Partner Violence:   . Fear of Current or Ex-Partner: Not on file  . Emotionally Abused: Not on file  . Physically Abused: Not on file  . Sexually Abused: Not on file    Allergies  Allergen Reactions  . Hydrocodone Itching  . Oxycodone Itching  . Shellfish Allergy Nausea And Vomiting  . Sulfamethoxazole Nausea Only    Current Outpatient Medications  Medication Sig Dispense Refill  . amLODipine (NORVASC) 10 MG tablet Take 10 mg by mouth every morning.    Marland Kitchen aspirin 81 MG EC tablet Take  by mouth.    Marland Kitchen atorvastatin (LIPITOR) 10 MG tablet Take 10 mg by mouth daily. Patient takes in am    . carboxymethylcellulose (REFRESH) 1 % ophthalmic solution Apply 1 drop to eye daily.      . cetirizine (ZYRTEC) 10 MG tablet Take 10 mg by mouth daily.      . Cholecalciferol (VITAMIN D) 2000 UNITS tablet Take 2,000 Units by mouth every morning.     Marland Kitchen JANUVIA 100 MG tablet Take 100 mg by mouth daily.    Marland Kitchen losartan-hydrochlorothiazide (HYZAAR) 50-12.5 MG tablet Take 2 tablets by mouth daily.    . metFORMIN (GLUCOPHAGE) 1000 MG tablet     . mometasone (NASONEX) 50 MCG/ACT nasal spray Place 2 sprays into the nose every morning.      . montelukast (SINGULAIR) 10 MG tablet Take 10 mg by mouth every morning.      No current facility-administered medications for this visit.    ROS:    General:  No weight loss, Fever, chills  HEENT: No recent headaches, no nasal bleeding, no visual changes, no sore throat  Neurologic: No dizziness, blackouts, seizures. No recent symptoms of stroke or mini- stroke. No recent episodes of slurred speech, or temporary blindness.  Cardiac: No recent episodes of chest pain/pressure, no shortness of breath at rest.  No shortness of breath with exertion.  Denies history of atrial fibrillation or irregular heartbeat  Vascular: No history of rest pain in feet.  No history of claudication.  No history of non-healing ulcer, No history of DVT   Pulmonary: No home oxygen, no productive cough, no hemoptysis,  No asthma or wheezing  Musculoskeletal:  [ ]  Arthritis, [ ]  Low back pain,  [ ]  Joint pain  Hematologic:No history of hypercoagulable state.  No history of easy bleeding.  No history of anemia  Gastrointestinal: No hematochezia or melena,  No gastroesophageal reflux, no trouble swallowing  Urinary: [ ]  chronic Kidney disease, [ ]  on HD - [ ]  MWF or [ ]  TTHS, [ ]  Burning with urination, [ ]  Frequent urination, [ ]  Difficulty urinating;   Skin: No rashes  Psychological: No history of anxiety,  No history of depression   Physical Examination  Vitals:   09/18/19 0931 09/18/19 0934  BP: (!) 141/79 (!) 147/81  Pulse: 100   Resp: 20   Temp: (!) 97.5 F (36.4 C)   SpO2: 99%   Weight: 212 lb (96.2 kg)   Height: 5\' 9"  (1.753 m)     Body mass index is 31.31 kg/m.  General:  Alert and oriented, no acute distress HEENT: Normal Neck: No JVD Cardiac: Regular Rate and Rhythm  Skin: No rash Extremity Pulses:  2+ radial, brachial pulses bilaterally Musculoskeletal: No deformity or edema  Neurologic: Upper and lower extremity motor 5/5 and symmetric  DATA:  CT angio of the head and neck shows 30% left internal carotid artery stenosis subtotal occlusion of the right internal carotid artery and occlusion of the right vertebral  artery  ASSESSMENT: Asymptomatic subtotal occlusion right internal carotid artery   PLAN: Right carotid endarterectomy scheduled for September 30, 2019.  Risk benefits possible complications and procedure details including but not limited to bleeding infection cranial nerve injury stroke risk were discussed with the patient today.  She understands and agrees to proceed.   Of note she states she has been slow to wake up anesthesia in the past.  I told her to discuss this with the anesthesia team.  Ruta Hinds,  MD [facility] Office: [phone] Pager: [phone]

## 2019-09-19 ENCOUNTER — Other Ambulatory Visit: Payer: Self-pay

## 2019-09-25 NOTE — Pre-Procedure Instructions (Signed)
Kristin Byrd  09/25/2019      [place], [place] [address] Phone: [phone] Fax: [phone]    Your procedure is scheduled on 09/30/19.  Report to [facility] Admitting at 8:45 A.M.  Call this number if you have problems the morning of surgery:  [phone]   Remember:  Do not eat or drink after midnight.  Yo   Take these medicines the morning of surgery with A SIP OF WATER ---NORVASC,SINGULAR    Do not wear jewelry, make-up or nail polish.  Do not wear lotions, powders, or perfumes, or deodorant.  Do not shave 48 hours prior to surgery.  Men may shave face and neck.  Do not bring valuables to the hospital.  [facility] is not responsible for any belongings or valuables.  Contacts, dentures or bridgework may not be worn into surgery.  Leave your suitcase in the car.  After surgery it may be brought to your room.  For patients admitted to the hospital, discharge time will be determined by your treatment team.  Patients discharged the day of surgery will not be allowed to drive home.    Special instructions:  Do not take any aspirin,anti-inflammatories,vitamins,or herbal supplements 5-7 days prior to surgery. [place] - Preparing for Surgery  Before surgery, you can play an important role.  Because skin is not sterile, your skin needs to be as free of germs as possible.  You can reduce the number of germs on you skin by washing with CHG (chlorahexidine gluconate) soap before surgery.  CHG is an antiseptic cleaner which kills germs and bonds with the skin to continue killing germs even after washing.  Oral Hygiene is also important in reducing the risk of infection.  Remember to brush your teeth with your regular toothpaste the morning of surgery.  Please DO NOT use if you have an allergy to CHG or antibacterial soaps.  If your skin becomes reddened/irritated stop using the CHG and inform your nurse  when you arrive at Short Stay.  Do not shave (including legs and underarms) for at least 48 hours prior to the first CHG shower.  You may shave your face.  Please follow these instructions carefully:   1.  Shower with CHG Soap the night before surgery and the morning of Surgery.  2.  If you choose to wash your hair, wash your hair first as usual with your normal shampoo.  3.  After you shampoo, rinse your hair and body thoroughly to remove the shampoo. 4.  Use CHG as you would any other liquid soap.  You can apply chg directly to the skin and wash gently with a      scrungie or washcloth.           5.  Apply the CHG Soap to your body ONLY FROM THE NECK DOWN.   Do not use on open wounds or open sores. Avoid contact with your eyes, ears, mouth and genitals (private parts).  Wash genitals (private parts) with your normal soap.  6.  Wash thoroughly, paying special attention to the area where your surgery will be performed.  7.  Thoroughly rinse your body with warm water from the neck down.  8.  DO NOT shower/wash with your normal soap after using and rinsing off the CHG Soap.  9.  Pat yourself dry with a clean towel.  10.  Wear clean pajamas.            11.  Place clean sheets on your bed the night of your first shower and do not sleep with pets.  Day of Surgery  Do not apply any lotions/deoderants the morning of surgery.   Please wear clean clothes to the hospital/surgery center. Remember to brush your teeth with toothpaste.  Do not take any aspirin,anti-inflammatories,vitamins,or herbal supplements 5-7 days prior to surgery. Please read over the following fact sheets that you were given. MRSA Information    .how to    How to Manage Your Diabetes Before and After Surgery  Why is it important to control my blood sugar before and after surgery? . Improving blood sugar levels before and after surgery helps healing and can limit problems. . A way of improving blood sugar  control is eating a healthy diet by: o  Eating less sugar and carbohydrates o  Increasing activity/exercise o  Talking with your doctor about reaching your blood sugar goals . High blood sugars (greater than 180 mg/dL) can raise your risk of infections and slow your recovery, so you will need to focus on controlling your diabetes during the weeks before surgery. . Make sure that the doctor who takes care of your diabetes knows about your planned surgery including the date and location.  How do I manage my blood sugar before surgery? . Check your blood sugar at least 4 times a day, starting 2 days before surgery, to make sure that the level is not too high or low. o Check your blood sugar the morning of your surgery when you wake up and every 2 hours until you get to the Short Stay unit. . If your blood sugar is less than 70 mg/dL, you will need to treat for low blood sugar: o Do not take insulin. o Treat a low blood sugar (less than 70 mg/dL) with  cup of clear juice (cranberry or apple), 4 glucose tablets, OR glucose gel. Recheck blood sugar in 15 minutes after treatment (to make sure it is greater than 70 mg/dL). If your blood sugar is not greater than 70 mg/dL on recheck, call [phone] o  for further instructions. . Report your blood sugar to the short stay nurse when you get to Short Stay.  . If you are admitted to the hospital after surgery: o Your blood sugar will be checked by the staff and you will probably be given insulin after surgery (instead of oral diabetes medicines) to make sure you have good blood sugar levels. o The goal for blood sugar control after surgery is 80-180 mg/dL.              WHAT DO I DO ABOUT MY DIABETES MEDICATION?   Marland Kitchen Do not take oral diabetes medicines (pills) the morning of surgery.  . The day of surgery, do not take other diabetes injectables, including Byetta (exenatide), Bydureon (exenatide ER), Victoza (liraglutide), or Trulicity  (dulaglutide).  . If your CBG is greater than 220 mg/dL, you may take  of your sliding scale (correction) dose of insulin.  Other Instructions:          Patient Signature:  Date:   Nurse Signature:  Date:   Reviewed and Endorsed by [facility] Patient Education Committee, August 2015

## 2019-09-26 ENCOUNTER — Encounter (HOSPITAL_COMMUNITY): Payer: Self-pay

## 2019-09-26 ENCOUNTER — Other Ambulatory Visit: Payer: Self-pay

## 2019-09-26 ENCOUNTER — Other Ambulatory Visit (HOSPITAL_COMMUNITY)
Admission: RE | Admit: 2019-09-26 | Discharge: 2019-09-26 | Disposition: A | Discharge status: Home / Self Care | Payer: PPO | Source: Ambulatory Visit | Attending: Vascular Surgery | Admitting: Vascular Surgery

## 2019-09-26 ENCOUNTER — Encounter (HOSPITAL_COMMUNITY)
Admission: RE | Admit: 2019-09-26 | Discharge: 2019-09-26 | Disposition: A | Discharge status: Home / Self Care | Payer: PPO | Source: Ambulatory Visit | Attending: Vascular Surgery | Admitting: Vascular Surgery

## 2019-09-26 DIAGNOSIS — Z01818 Encounter for other preprocedural examination: Secondary | ICD-10-CM | POA: Insufficient documentation

## 2019-09-26 DIAGNOSIS — Z20822 Contact with and (suspected) exposure to covid-19: Secondary | ICD-10-CM | POA: Diagnosis not present

## 2019-09-26 LAB — TYPE AND SCREEN
ABO/RH(D): O POS
Antibody Screen: NEGATIVE

## 2019-09-26 LAB — COMPREHENSIVE METABOLIC PANEL
ALT: 50 U/L — ABNORMAL HIGH (ref 0–44)
AST: 30 U/L (ref 15–41)
Albumin: 4 g/dL (ref 3.5–5.0)
Alkaline Phosphatase: 52 U/L (ref 38–126)
Anion gap: 11 (ref 5–15)
BUN: 13 mg/dL (ref 8–23)
CO2: 25 mmol/L (ref 22–32)
Calcium: 9.3 mg/dL (ref 8.9–10.3)
Chloride: 100 mmol/L (ref 98–111)
Creatinine, Ser: 0.82 mg/dL (ref 0.44–1.00)
GFR calc Af Amer: >60 mL/min (ref >60)
GFR calc non Af Amer: >60 mL/min (ref >60)
Glucose, Bld: 108 mg/dL — ABNORMAL HIGH (ref 70–99)
Potassium: 3.8 mmol/L (ref 3.5–5.1)
Sodium: 136 mmol/L (ref 135–145)
Total Bilirubin: 0.8 mg/dL (ref 0.3–1.2)
Total Protein: 6.5 g/dL (ref 6.5–8.1)

## 2019-09-26 LAB — CBC
HCT: 42.5 % (ref 36.0–46.0)
Hemoglobin: 14.4 g/dL (ref 12.0–15.0)
MCH: 31.9 pg (ref 26.0–34.0)
MCHC: 33.9 g/dL (ref 30.0–36.0)
MCV: 94.2 fL (ref 80.0–100.0)
Platelets: 312 10*3/uL (ref 150–400)
RBC: 4.51 MIL/uL (ref 3.87–5.11)
RDW: 11.9 % (ref 11.5–15.5)
WBC: 9 10*3/uL (ref 4.0–10.5)
nRBC: 0 % (ref 0.0–0.2)

## 2019-09-26 LAB — URINALYSIS, ROUTINE W REFLEX MICROSCOPIC
Bacteria, UA: NONE SEEN
Bilirubin Urine: NEGATIVE
Glucose, UA: NEGATIVE mg/dL
Hgb urine dipstick: NEGATIVE
Ketones, ur: NEGATIVE mg/dL
Nitrite: NEGATIVE
Protein, ur: NEGATIVE mg/dL
Specific Gravity, Urine: 1.01 (ref 1.005–1.030)
pH: 6 (ref 5.0–8.0)

## 2019-09-26 LAB — PROTIME-INR
INR: 1 (ref 0.8–1.2)
Prothrombin Time: 13 seconds (ref 11.4–15.2)

## 2019-09-26 LAB — APTT: aPTT: 28 seconds (ref 24–36)

## 2019-09-26 LAB — SURGICAL PCR SCREEN
MRSA, PCR: NEGATIVE
Staphylococcus aureus: NEGATIVE

## 2019-09-26 LAB — GLUCOSE, CAPILLARY: Glucose-Capillary: 102 mg/dL — ABNORMAL HIGH (ref 70–99)

## 2019-09-26 LAB — ABO/RH: ABO/RH(D): O POS

## 2019-09-26 NOTE — Progress Notes (Signed)
PCP - Clydie Braun PA  Show Low MD   Chest x-ray - n/a EKG - 09/26/19     Fasting Blood Sugar -  Checks Blood Sugar _____ times a day  Blood Thinner Instructions: Aspirin Instructions: proceed as surgeons instructions  COVID TEST- 09/26/19   Anesthesia review: yes - heart history  Patient denies shortness of breath, fever, cough and chest pain at PAT appointment   All instructions explained to the patient, with a verbal understanding of the material. Patient agrees to go over the instructions while at home for a better understanding. Patient also instructed to self quarantine after being tested for COVID-19. The opportunity to ask questions was provided.

## 2019-09-27 LAB — NOVEL CORONAVIRUS, NAA (HOSP ORDER, SEND-OUT TO REF LAB; TAT 18-24 HRS): SARS-CoV-2, NAA: NOT DETECTED

## 2019-09-29 ENCOUNTER — Encounter (HOSPITAL_COMMUNITY): Payer: Self-pay

## 2019-09-29 DIAGNOSIS — Z79899 Other long term (current) drug therapy: Secondary | ICD-10-CM | POA: Diagnosis not present

## 2019-09-29 DIAGNOSIS — M1990 Unspecified osteoarthritis, unspecified site: Secondary | ICD-10-CM | POA: Diagnosis present

## 2019-09-29 DIAGNOSIS — I6523 Occlusion and stenosis of bilateral carotid arteries: Secondary | ICD-10-CM | POA: Diagnosis present

## 2019-09-29 DIAGNOSIS — Z91013 Allergy to seafood: Secondary | ICD-10-CM | POA: Diagnosis not present

## 2019-09-29 DIAGNOSIS — I10 Essential (primary) hypertension: Secondary | ICD-10-CM | POA: Diagnosis present

## 2019-09-29 DIAGNOSIS — Z9049 Acquired absence of other specified parts of digestive tract: Secondary | ICD-10-CM | POA: Diagnosis not present

## 2019-09-29 DIAGNOSIS — Z7984 Long term (current) use of oral hypoglycemic drugs: Secondary | ICD-10-CM | POA: Diagnosis not present

## 2019-09-29 DIAGNOSIS — Z882 Allergy status to sulfonamides status: Secondary | ICD-10-CM | POA: Diagnosis not present

## 2019-09-29 DIAGNOSIS — Z823 Family history of stroke: Secondary | ICD-10-CM | POA: Diagnosis not present

## 2019-09-29 DIAGNOSIS — Z96643 Presence of artificial hip joint, bilateral: Secondary | ICD-10-CM | POA: Diagnosis present

## 2019-09-29 DIAGNOSIS — Z7982 Long term (current) use of aspirin: Secondary | ICD-10-CM | POA: Diagnosis not present

## 2019-09-29 DIAGNOSIS — E119 Type 2 diabetes mellitus without complications: Secondary | ICD-10-CM | POA: Diagnosis present

## 2019-09-29 DIAGNOSIS — Z885 Allergy status to narcotic agent status: Secondary | ICD-10-CM | POA: Diagnosis not present

## 2019-09-29 DIAGNOSIS — I6521 Occlusion and stenosis of right carotid artery: Secondary | ICD-10-CM | POA: Diagnosis present

## 2019-09-29 LAB — HEMOGLOBIN A1C
Hgb A1c MFr Bld: 6.3 % — ABNORMAL HIGH (ref 4.8–5.6)
Mean Plasma Glucose: 134.11 mg/dL

## 2019-09-30 ENCOUNTER — Inpatient Hospital Stay (HOSPITAL_COMMUNITY): Payer: PPO | Admitting: Physician Assistant

## 2019-09-30 ENCOUNTER — Other Ambulatory Visit: Payer: Self-pay

## 2019-09-30 ENCOUNTER — Inpatient Hospital Stay (HOSPITAL_COMMUNITY)
Admission: RE | Admit: 2019-09-30 | Discharge: 2019-10-01 | DRG: 039 | Disposition: A | Discharge status: Home / Self Care | Payer: PPO | Attending: Vascular Surgery | Admitting: Vascular Surgery

## 2019-09-30 ENCOUNTER — Encounter (HOSPITAL_COMMUNITY): Payer: Self-pay | Admitting: Vascular Surgery

## 2019-09-30 ENCOUNTER — Encounter (HOSPITAL_COMMUNITY)
Admission: RE | Disposition: A | Discharge status: Home / Self Care | Payer: Self-pay | Source: Home / Self Care | Attending: Vascular Surgery

## 2019-09-30 DIAGNOSIS — I6521 Occlusion and stenosis of right carotid artery: Secondary | ICD-10-CM | POA: Diagnosis present

## 2019-09-30 DIAGNOSIS — Z91013 Allergy to seafood: Secondary | ICD-10-CM

## 2019-09-30 DIAGNOSIS — M1990 Unspecified osteoarthritis, unspecified site: Secondary | ICD-10-CM | POA: Diagnosis present

## 2019-09-30 DIAGNOSIS — Z7984 Long term (current) use of oral hypoglycemic drugs: Secondary | ICD-10-CM | POA: Diagnosis not present

## 2019-09-30 DIAGNOSIS — Z79899 Other long term (current) drug therapy: Secondary | ICD-10-CM | POA: Diagnosis not present

## 2019-09-30 DIAGNOSIS — Z882 Allergy status to sulfonamides status: Secondary | ICD-10-CM

## 2019-09-30 DIAGNOSIS — I10 Essential (primary) hypertension: Secondary | ICD-10-CM | POA: Diagnosis present

## 2019-09-30 DIAGNOSIS — Z7982 Long term (current) use of aspirin: Secondary | ICD-10-CM

## 2019-09-30 DIAGNOSIS — E119 Type 2 diabetes mellitus without complications: Secondary | ICD-10-CM | POA: Diagnosis present

## 2019-09-30 DIAGNOSIS — Z9049 Acquired absence of other specified parts of digestive tract: Secondary | ICD-10-CM

## 2019-09-30 DIAGNOSIS — Z823 Family history of stroke: Secondary | ICD-10-CM

## 2019-09-30 DIAGNOSIS — I6523 Occlusion and stenosis of bilateral carotid arteries: Secondary | ICD-10-CM | POA: Diagnosis present

## 2019-09-30 DIAGNOSIS — Z885 Allergy status to narcotic agent status: Secondary | ICD-10-CM

## 2019-09-30 DIAGNOSIS — Z96643 Presence of artificial hip joint, bilateral: Secondary | ICD-10-CM | POA: Diagnosis present

## 2019-09-30 HISTORY — PX: PATCH ANGIOPLASTY: SHX6230

## 2019-09-30 HISTORY — PX: ENDARTERECTOMY: SHX5162

## 2019-09-30 LAB — GLUCOSE, CAPILLARY
Glucose-Capillary: 137 mg/dL — ABNORMAL HIGH (ref 70–99)
Glucose-Capillary: 178 mg/dL — ABNORMAL HIGH (ref 70–99)

## 2019-09-30 SURGERY — ENDARTERECTOMY, CAROTID
Anesthesia: General | Site: Neck | Laterality: Right

## 2019-09-30 MED ORDER — SODIUM CHLORIDE 0.9 % IV SOLN: INTRAVENOUS | Status: DC

## 2019-09-30 MED ORDER — PROPOFOL 10 MG/ML IV BOLUS
INTRAVENOUS | Status: AC
  Filled 2019-09-30: qty 20

## 2019-09-30 MED ORDER — LACTATED RINGERS IV SOLN: INTRAVENOUS | Status: DC | PRN

## 2019-09-30 MED ORDER — CEFAZOLIN SODIUM-DEXTROSE 2-4 GM/100ML-% IV SOLN
2.0000 g | INTRAVENOUS | Status: AC
  Administered 2019-09-30: 2 g via INTRAVENOUS
  Filled 2019-09-30: qty 100

## 2019-09-30 MED ORDER — DEXAMETHASONE SODIUM PHOSPHATE 10 MG/ML IJ SOLN
INTRAMUSCULAR | Status: DC | PRN
  Administered 2019-09-30: 5 mg via INTRAVENOUS

## 2019-09-30 MED ORDER — LOSARTAN POTASSIUM 50 MG PO TABS
50.0000 mg | ORAL_TABLET | Freq: Every day | ORAL | Status: DC
  Administered 2019-10-01: 50 mg via ORAL
  Filled 2019-09-30: qty 1

## 2019-09-30 MED ORDER — SODIUM CHLORIDE 0.9 % IV SOLN
INTRAVENOUS | Status: DC | PRN
  Administered 2019-09-30: 500 mL

## 2019-09-30 MED ORDER — BISACODYL 10 MG RE SUPP: 10.0000 mg | Freq: Every day | RECTAL | Status: DC | PRN

## 2019-09-30 MED ORDER — CHLORHEXIDINE GLUCONATE CLOTH 2 % EX PADS: 6.0000 | MEDICATED_PAD | Freq: Once | CUTANEOUS | Status: DC

## 2019-09-30 MED ORDER — 0.9 % SODIUM CHLORIDE (POUR BTL) OPTIME
TOPICAL | Status: DC | PRN
  Administered 2019-09-30 (×2): 1000 mL

## 2019-09-30 MED ORDER — POLYETHYLENE GLYCOL 3350 17 G PO PACK: 17.0000 g | PACK | Freq: Every day | ORAL | Status: DC | PRN

## 2019-09-30 MED ORDER — DIPHENHYDRAMINE HCL 25 MG PO CAPS: 25.0000 mg | ORAL_CAPSULE | Freq: Four times a day (QID) | ORAL | Status: DC | PRN

## 2019-09-30 MED ORDER — OXYCODONE-ACETAMINOPHEN 5-325 MG PO TABS: 1.0000 | ORAL_TABLET | ORAL | Status: DC | PRN

## 2019-09-30 MED ORDER — ATORVASTATIN CALCIUM 10 MG PO TABS
10.0000 mg | ORAL_TABLET | Freq: Every day | ORAL | Status: DC
  Administered 2019-10-01: 09:00:00 10 mg via ORAL
  Filled 2019-09-30: qty 1

## 2019-09-30 MED ORDER — FENTANYL CITRATE (PF) 250 MCG/5ML IJ SOLN
INTRAMUSCULAR | Status: AC
  Filled 2019-09-30: qty 5

## 2019-09-30 MED ORDER — HEPARIN SODIUM (PORCINE) 1000 UNIT/ML IJ SOLN
INTRAMUSCULAR | Status: DC | PRN
  Administered 2019-09-30: 10000 [IU] via INTRAVENOUS

## 2019-09-30 MED ORDER — LABETALOL HCL 5 MG/ML IV SOLN: 10.0000 mg | INTRAVENOUS | Status: DC | PRN

## 2019-09-30 MED ORDER — PROPOFOL 10 MG/ML IV BOLUS
INTRAVENOUS | Status: DC | PRN
  Administered 2019-09-30: 150 mg via INTRAVENOUS
  Administered 2019-09-30: 50 mg via INTRAVENOUS

## 2019-09-30 MED ORDER — CEFAZOLIN SODIUM-DEXTROSE 2-4 GM/100ML-% IV SOLN
2.0000 g | Freq: Three times a day (TID) | INTRAVENOUS | Status: AC
  Administered 2019-09-30 – 2019-10-01 (×2): 2 g via INTRAVENOUS
  Filled 2019-09-30 (×2): qty 100

## 2019-09-30 MED ORDER — ONDANSETRON HCL 4 MG/2ML IJ SOLN
INTRAMUSCULAR | Status: AC
  Filled 2019-09-30: qty 2

## 2019-09-30 MED ORDER — SODIUM CHLORIDE 0.9 % IV SOLN
INTRAVENOUS | Status: AC
  Filled 2019-09-30: qty 1.2

## 2019-09-30 MED ORDER — ROCURONIUM BROMIDE 10 MG/ML (PF) SYRINGE
PREFILLED_SYRINGE | INTRAVENOUS | Status: AC
  Filled 2019-09-30: qty 10

## 2019-09-30 MED ORDER — SUGAMMADEX SODIUM 200 MG/2ML IV SOLN
INTRAVENOUS | Status: DC | PRN
  Administered 2019-09-30: 300 mg via INTRAVENOUS

## 2019-09-30 MED ORDER — HYDROCHLOROTHIAZIDE 12.5 MG PO CAPS
12.5000 mg | ORAL_CAPSULE | Freq: Every day | ORAL | Status: DC
  Administered 2019-10-01: 12.5 mg via ORAL
  Filled 2019-09-30: qty 1

## 2019-09-30 MED ORDER — ACETAMINOPHEN 325 MG PO TABS
325.0000 mg | ORAL_TABLET | ORAL | Status: DC | PRN
  Administered 2019-09-30 – 2019-10-01 (×3): 650 mg via ORAL
  Filled 2019-09-30 (×3): qty 2

## 2019-09-30 MED ORDER — ONDANSETRON HCL 4 MG/2ML IJ SOLN: 4.0000 mg | Freq: Four times a day (QID) | INTRAMUSCULAR | Status: DC | PRN

## 2019-09-30 MED ORDER — POTASSIUM CHLORIDE CRYS ER 20 MEQ PO TBCR: 20.0000 meq | EXTENDED_RELEASE_TABLET | Freq: Every day | ORAL | Status: DC | PRN

## 2019-09-30 MED ORDER — HYDROMORPHONE HCL 1 MG/ML IJ SOLN: 0.2500 mg | INTRAMUSCULAR | Status: DC | PRN

## 2019-09-30 MED ORDER — ACETAMINOPHEN 325 MG RE SUPP: 325.0000 mg | RECTAL | Status: DC | PRN

## 2019-09-30 MED ORDER — MAGNESIUM SULFATE 2 GM/50ML IV SOLN: 2.0000 g | Freq: Every day | INTRAVENOUS | Status: DC | PRN

## 2019-09-30 MED ORDER — ALUM & MAG HYDROXIDE-SIMETH 200-200-20 MG/5ML PO SUSP: 15.0000 mL | ORAL | Status: DC | PRN

## 2019-09-30 MED ORDER — ASPIRIN EC 81 MG PO TBEC
81.0000 mg | DELAYED_RELEASE_TABLET | Freq: Every day | ORAL | Status: DC
  Administered 2019-09-30: 81 mg via ORAL
  Filled 2019-09-30: qty 1

## 2019-09-30 MED ORDER — LIDOCAINE 2% (20 MG/ML) 5 ML SYRINGE
INTRAMUSCULAR | Status: AC
  Filled 2019-09-30: qty 10

## 2019-09-30 MED ORDER — DEXAMETHASONE SODIUM PHOSPHATE 10 MG/ML IJ SOLN
INTRAMUSCULAR | Status: AC
  Filled 2019-09-30: qty 1

## 2019-09-30 MED ORDER — ONDANSETRON HCL 4 MG/2ML IJ SOLN
INTRAMUSCULAR | Status: DC | PRN
  Administered 2019-09-30: 4 mg via INTRAVENOUS

## 2019-09-30 MED ORDER — MIDAZOLAM HCL 2 MG/2ML IJ SOLN
INTRAMUSCULAR | Status: AC
  Filled 2019-09-30: qty 2

## 2019-09-30 MED ORDER — AMLODIPINE BESYLATE 10 MG PO TABS
10.0000 mg | ORAL_TABLET | Freq: Every day | ORAL | Status: DC
  Administered 2019-10-01: 10 mg via ORAL
  Filled 2019-09-30: qty 1

## 2019-09-30 MED ORDER — MORPHINE SULFATE (PF) 2 MG/ML IV SOLN: 2.0000 mg | INTRAVENOUS | Status: DC | PRN

## 2019-09-30 MED ORDER — LIDOCAINE HCL (CARDIAC) PF 100 MG/5ML IV SOSY
PREFILLED_SYRINGE | INTRAVENOUS | Status: DC | PRN
  Administered 2019-09-30: 100 mg via INTRAVENOUS

## 2019-09-30 MED ORDER — GUAIFENESIN-DM 100-10 MG/5ML PO SYRP: 15.0000 mL | ORAL_SOLUTION | ORAL | Status: DC | PRN

## 2019-09-30 MED ORDER — PANTOPRAZOLE SODIUM 40 MG PO TBEC
40.0000 mg | DELAYED_RELEASE_TABLET | Freq: Every day | ORAL | Status: DC
  Administered 2019-10-01: 40 mg via ORAL
  Filled 2019-09-30: qty 1

## 2019-09-30 MED ORDER — ROCURONIUM BROMIDE 100 MG/10ML IV SOLN
INTRAVENOUS | Status: DC | PRN
  Administered 2019-09-30: 100 mg via INTRAVENOUS
  Administered 2019-09-30: 10 mg via INTRAVENOUS
  Administered 2019-09-30: 20 mg via INTRAVENOUS

## 2019-09-30 MED ORDER — HEPARIN SODIUM (PORCINE) 1000 UNIT/ML IJ SOLN
INTRAMUSCULAR | Status: AC
  Filled 2019-09-30: qty 1

## 2019-09-30 MED ORDER — PHENOL 1.4 % MT LIQD: 1.0000 | OROMUCOSAL | Status: DC | PRN

## 2019-09-30 MED ORDER — FENTANYL CITRATE (PF) 100 MCG/2ML IJ SOLN
INTRAMUSCULAR | Status: DC | PRN
  Administered 2019-09-30: 25 ug via INTRAVENOUS
  Administered 2019-09-30: 50 ug via INTRAVENOUS
  Administered 2019-09-30: 100 ug via INTRAVENOUS
  Administered 2019-09-30 (×3): 25 ug via INTRAVENOUS

## 2019-09-30 MED ORDER — METOPROLOL TARTRATE 5 MG/5ML IV SOLN: 2.0000 mg | INTRAVENOUS | Status: DC | PRN

## 2019-09-30 MED ORDER — LOSARTAN POTASSIUM-HCTZ 50-12.5 MG PO TABS: 2.0000 | ORAL_TABLET | Freq: Every day | ORAL | Status: DC

## 2019-09-30 MED ORDER — PHENYLEPHRINE 40 MCG/ML (10ML) SYRINGE FOR IV PUSH (FOR BLOOD PRESSURE SUPPORT)
PREFILLED_SYRINGE | INTRAVENOUS | Status: AC
  Filled 2019-09-30: qty 10

## 2019-09-30 MED ORDER — PROMETHAZINE HCL 25 MG/ML IJ SOLN: 6.2500 mg | INTRAMUSCULAR | Status: DC | PRN

## 2019-09-30 MED ORDER — HYDRALAZINE HCL 20 MG/ML IJ SOLN: 5.0000 mg | INTRAMUSCULAR | Status: DC | PRN

## 2019-09-30 MED ORDER — SODIUM CHLORIDE 0.9 % IV SOLN: 500.0000 mL | Freq: Once | INTRAVENOUS | Status: DC | PRN

## 2019-09-30 MED ORDER — MONTELUKAST SODIUM 10 MG PO TABS
10.0000 mg | ORAL_TABLET | Freq: Every day | ORAL | Status: DC
  Administered 2019-10-01: 10 mg via ORAL
  Filled 2019-09-30: qty 1

## 2019-09-30 MED ORDER — DOCUSATE SODIUM 100 MG PO CAPS
100.0000 mg | ORAL_CAPSULE | Freq: Every day | ORAL | Status: DC
  Administered 2019-10-01: 100 mg via ORAL
  Filled 2019-09-30: qty 1

## 2019-09-30 MED ORDER — LIDOCAINE HCL (PF) 1 % IJ SOLN
INTRAMUSCULAR | Status: AC
  Filled 2019-09-30: qty 30

## 2019-09-30 MED ORDER — PHENYLEPHRINE HCL-NACL 10-0.9 MG/250ML-% IV SOLN
INTRAVENOUS | Status: DC | PRN
  Administered 2019-09-30: 80 ug/min via INTRAVENOUS

## 2019-09-30 SURGICAL SUPPLY — 45 items
ADH SKN CLS APL DERMABOND .7 (GAUZE/BANDAGES/DRESSINGS) ×2
AGENT HMST SPONGE THK3/8 (HEMOSTASIS)
CANISTER SUCT 3000ML PPV (MISCELLANEOUS) ×3 IMPLANT
CANNULA VESSEL 3MM 2 BLNT TIP (CANNULA) ×6 IMPLANT
CATH ROBINSON RED A/P 18FR (CATHETERS) ×3 IMPLANT
CLIP VESOCCLUDE MED 6/CT (CLIP) ×3 IMPLANT
CLIP VESOCCLUDE SM WIDE 6/CT (CLIP) ×3 IMPLANT
DECANTER SPIKE VIAL GLASS SM (MISCELLANEOUS) IMPLANT
DERMABOND ADVANCED (GAUZE/BANDAGES/DRESSINGS) ×1
DERMABOND ADVANCED .7 DNX12 (GAUZE/BANDAGES/DRESSINGS) ×2 IMPLANT
DRAIN HEMOVAC 1/8 X 5 (WOUND CARE) IMPLANT
ELECT REM PT RETURN 9FT ADLT (ELECTROSURGICAL) ×3
ELECTRODE REM PT RTRN 9FT ADLT (ELECTROSURGICAL) ×2 IMPLANT
EVACUATOR SILICONE 100CC (DRAIN) IMPLANT
GLOVE BIO SURGEON STRL SZ 6 (GLOVE) ×1 IMPLANT
GLOVE BIO SURGEON STRL SZ7.5 (GLOVE) ×3 IMPLANT
GLOVE BIOGEL PI IND STRL 6.5 (GLOVE) IMPLANT
GLOVE BIOGEL PI INDICATOR 6.5 (GLOVE) ×3
GLOVE ECLIPSE 6.5 STRL STRAW (GLOVE) ×2 IMPLANT
GOWN STRL REUS W/ TWL LRG LVL3 (GOWN DISPOSABLE) ×6 IMPLANT
GOWN STRL REUS W/TWL LRG LVL3 (GOWN DISPOSABLE) ×12
HEMOSTAT SPONGE AVITENE ULTRA (HEMOSTASIS) IMPLANT
KIT BASIN OR (CUSTOM PROCEDURE TRAY) ×3 IMPLANT
KIT SHUNT ARGYLE CAROTID ART 6 (VASCULAR PRODUCTS) ×1 IMPLANT
KIT TURNOVER KIT B (KITS) ×3 IMPLANT
NDL HYPO 25GX1X1/2 BEV (NEEDLE) IMPLANT
NEEDLE HYPO 25GX1X1/2 BEV (NEEDLE) IMPLANT
NS IRRIG 1000ML POUR BTL (IV SOLUTION) ×6 IMPLANT
PACK CAROTID (CUSTOM PROCEDURE TRAY) ×3 IMPLANT
PAD ARMBOARD 7.5X6 YLW CONV (MISCELLANEOUS) ×6 IMPLANT
PATCH HEMASHIELD 8X75 (Vascular Products) ×1 IMPLANT
POSITIONER HEAD DONUT 9IN (MISCELLANEOUS) ×3 IMPLANT
SHUNT CAROTID BYPASS 10 (VASCULAR PRODUCTS) IMPLANT
SHUNT CAROTID BYPASS 12FRX15.5 (VASCULAR PRODUCTS) IMPLANT
SUT ETHILON 3 0 PS 1 (SUTURE) IMPLANT
SUT PROLENE 6 0 CC (SUTURE) ×3 IMPLANT
SUT PROLENE 7 0 BV1 MDA (SUTURE) ×1 IMPLANT
SUT SILK 3 0 TIES 17X18 (SUTURE)
SUT SILK 3-0 18XBRD TIE BLK (SUTURE) IMPLANT
SUT VIC AB 3-0 SH 27 (SUTURE) ×3
SUT VIC AB 3-0 SH 27X BRD (SUTURE) ×2 IMPLANT
SUT VICRYL 4-0 PS2 18IN ABS (SUTURE) ×3 IMPLANT
SYR CONTROL 10ML LL (SYRINGE) IMPLANT
TOWEL GREEN STERILE (TOWEL DISPOSABLE) ×3 IMPLANT
WATER STERILE IRR 1000ML POUR (IV SOLUTION) ×3 IMPLANT

## 2019-09-30 NOTE — Op Note (Signed)
Procedure: Right carotid endarterectomy  Preoperative diagnosis: >90% asymptomatic right internal carotid artery stenosis  Postoperative diagnosis: Same  Anesthesia General  Asst.: Risa Grill, [facility]  Operative findings: #1 greater than 90% right internal carotid stenosis                                                             #2 Dacron patch           #3 8 Fr shunt  Operative details: After obtaining informed consent, the patient was taken to the operating room. The patient was placed in a supine position on the operating room table. After induction of general anesthesia, the patient's entire neck and chest was prepped and draped in the usual sterile fashion. An oblique incision was made on the right aspect of the patient's neck anterior to the border the right sternocleidomastoid muscle. The incision was carried into the subcutaneous tissues and through the platysma. The sternocleidomastoid muscle was identified and reflected laterally. The omohyoid muscle was identified and this was divided with cautery. The common carotid artery was then found at the base of the incision this was dissected free circumferentially. It was fairly soft on palpation.  The vagus nerve was identified and protected. Dissection was then carried up to the level carotid bifurcation.   The hyperglossal nerve was above the primary area of dissection. It was identified and protected.   The internal carotid artery was dissected free circumferentially just below the level of the hypoglossal nerve and it was soft in character at this location and above any palpable disease. The vessel was fairly small.  A vessel loop was placed around this.  Next the external carotid and superior thyroid arteries were dissected free circumferentially and vessel loops were placed around these. The patient was given 10000 units of intravenous heparin.  After 2 minutes of circulation time and raising the mean arterial pressure to 90 mm mercury,  the distal internal carotid artery was controlled with small bulldog clamp. The external carotid and superior thyroid arteries were controlled with vessel loops. The common carotid artery was controlled with a peripheral DeBakey clamp. A longitudinal opening was made in the common carotid artery just below the bifurcation. The arteriotomy was extended distally up into the internal carotid with Potts scissors. There was a large calcified plaque with greater than 90% stenosis in the internal carotid with some ulceration.  A 8 Fr shunt was brought onto the field and fashioned to fit the patient's artery.  This was threaded into the distal internal carotid artery and allowed to backbleed thoroughly.  There was good but not pulsatile backbleeding.  This was then threaded into the common carotid and secured with a Rummel tourniquet.   There was no air at this point and flow was restored to the brain.  Attention was then turned to the common carotid artery once again. A suitable endarterectomy plane was obtained and endarterectomy was begun in the common carotid artery and a good proximal endpoint was obtained. An eversion endarterectomy was performed on the external carotid artery and a good endpoint was obtained. The plaque was then elevated in the internal carotid artery and a nice feathered distal endpoint was also obtained.  The plaque was passed off the table. All loose debris was then removed from the  carotid bed and everything was thoroughly irrigated with heparinized saline. There was some intima lifting distally so the posterior wall was tacked with several 7 0 prolene sutures. A Dacron patch was then brought on to the operative field and this was sewn on as a patch angioplasty using a running 6-0 Prolene suture. Prior to completion of the anastomosis the internal carotid artery was thoroughly backbled. This was then controlled again with a fine bulldog clamp.  The common carotid was thoroughly flushed forward.  The external carotid was also thoroughly backbled.  The remainder of the patch was completed and the anastomosis was secured. Flow was then restored first retrograde from the external carotid into the carotid bed then antegrade from the common carotid to the external carotid artery and after approximately 5 cardiac cycles to the internal carotid artery. Doppler was used to evaluate the external/internal and common carotid arteries and these all had good Doppler flow. Hemostasis was obtained with 1 additional repair suture.      The platysma muscle was reapproximated using a running 3-0 Vicryl suture. The skin was closed with 4 0 Vicryl subcuticular stitch.  The patient was awakened in the operating room and was moving upper and lower extremities symmetrically and following commands.  The patient was stable on arrival to the PACU.  Ruta Hinds, MD [facility] [place] Office: [phone] Pager: [phone]

## 2019-09-30 NOTE — Progress Notes (Signed)
Patient received from PACU to 4E27. VSS. CHG bath completed. On monitor CCMD notified. Oriented to room and call light.  Paulene Floor, RN

## 2019-09-30 NOTE — Anesthesia Postprocedure Evaluation (Signed)
Anesthesia Post Note  Patient: Kristin Byrd  Procedure(s) Performed: RIGHT ENDARTERECTOMY CAROTID (Right ) PATCH ANGIOPLASTY USING HEMASHIELD PLATINUM FINESSE SHIELD (Right Neck)     Patient location during evaluation: PACU Anesthesia Type: General Level of consciousness: awake and alert Pain management: pain level controlled Vital Signs Assessment: post-procedure vital signs reviewed and stable Respiratory status: spontaneous breathing, nonlabored ventilation, respiratory function stable and patient connected to nasal cannula oxygen Cardiovascular status: blood pressure returned to baseline and stable Postop Assessment: no apparent nausea or vomiting Anesthetic complications: no    Last Vitals:  Vitals:   09/30/19 0609 09/30/19 1055  BP: (!) 157/65 (!) 120/53  Pulse: 93 91  Resp: 18 (!) 8  Temp: 36.9 C 36.9 C  SpO2: 99% 96%    Last Pain:  Vitals:   09/30/19 1055  PainSc: 0-No pain    LLE Motor Response: Purposeful movement (09/30/19 1055)   RLE Motor Response: Purposeful movement (09/30/19 1055)        Kalyn Hofstra

## 2019-09-30 NOTE — Transfer of Care (Signed)
Immediate Anesthesia Transfer of Care Note  Patient: Kristin Byrd  Procedure(s) Performed: RIGHT ENDARTERECTOMY CAROTID (Right ) PATCH ANGIOPLASTY USING HEMASHIELD PLATINUM FINESSE SHIELD (Right Neck)  Patient Location: PACU  Anesthesia Type:General  Level of Consciousness: awake, alert  and oriented  Airway & Oxygen Therapy: Patient Spontanous Breathing and Patient connected to nasal cannula oxygen  Post-op Assessment: Report given to RN, Post -op Vital signs reviewed and stable, Patient moving all extremities and Patient able to stick tongue midline  Post vital signs: Reviewed and stable  Last Vitals:  Vitals Value Taken Time  BP 120/53 09/30/19 1054  Temp    Pulse 88 09/30/19 1057  Resp 10 09/30/19 1057  SpO2 97 % 09/30/19 1057  Vitals shown include unvalidated device data.  Last Pain:  Vitals:   09/30/19 0634  PainSc: 0-No pain         Complications: No apparent anesthesia complications

## 2019-09-30 NOTE — Anesthesia Preprocedure Evaluation (Signed)
Anesthesia Evaluation  Patient identified by MRN, date of birth, ID band Patient awake    Reviewed: Allergy & Precautions, H&P , NPO status , Patient's Chart, lab work & pertinent test results  History of Anesthesia Complications (+) PONV  Airway Mallampati: II  TM Distance: >3 FB Neck ROM: Full    Dental no notable dental hx.    Pulmonary neg pulmonary ROS,    Pulmonary exam normal breath sounds clear to auscultation       Cardiovascular hypertension, Pt. on medications Normal cardiovascular exam Rhythm:Regular Rate:Normal     Neuro/Psych negative neurological ROS  negative psych ROS   GI/Hepatic negative GI ROS, Neg liver ROS,   Endo/Other  diabetes  Renal/GU negative Renal ROS  negative genitourinary   Musculoskeletal  (+) Arthritis , Osteoarthritis,    Abdominal (+) + obese,   Peds negative pediatric ROS (+)  Hematology negative hematology ROS (+)   Anesthesia Other Findings   Reproductive/Obstetrics negative OB ROS                             Anesthesia Physical  Anesthesia Plan  ASA: III  Anesthesia Plan: General   Post-op Pain Management:    Induction: Intravenous  PONV Risk Score and Plan: 4 or greater and Ondansetron, Dexamethasone, Midazolam, Scopolamine patch - Pre-op and Treatment may vary due to age or medical condition  Airway Management Planned: Oral ETT  Additional Equipment: Arterial line  Intra-op Plan:   Post-operative Plan: Extubation in OR  Informed Consent: I have reviewed the patients History and Physical, chart, labs and discussed the procedure including the risks, benefits and alternatives for the proposed anesthesia with the patient or authorized representative who has indicated his/her understanding and acceptance.     Dental advisory given  Plan Discussed with: CRNA and Surgeon  Anesthesia Plan Comments:         Anesthesia Quick  Evaluation

## 2019-09-30 NOTE — Anesthesia Procedure Notes (Signed)
Procedure Name: Intubation Date/Time: 09/30/2019 7:48 AM Performed by: Fionnuala Hemmerich T, CRNA Pre-anesthesia Checklist: Patient identified, Emergency Drugs available, Suction available and Patient being monitored Patient Re-evaluated:Patient Re-evaluated prior to induction Oxygen Delivery Method: Circle system utilized Preoxygenation: Pre-oxygenation with 100% oxygen Induction Type: IV induction Ventilation: Mask ventilation without difficulty Laryngoscope Size: Miller and 3 Grade View: Grade II Tube type: Oral Tube size: 7.5 mm Number of attempts: 1 Airway Equipment and Method: Stylet and Oral airway Placement Confirmation: ETT inserted through vocal cords under direct vision,  positive ETCO2 and breath sounds checked- equal and bilateral Secured at: 22 cm Tube secured with: Tape Dental Injury: Teeth and Oropharynx as per pre-operative assessment

## 2019-09-30 NOTE — Interval H&P Note (Signed)
History and Physical Interval Note:  09/30/2019 7:24 AM  Kristin Byrd  has presented today for surgery, with the diagnosis of RIGHT CAROTID ENDARTERECTOMY.  The various methods of treatment have been discussed with the patient and family. After consideration of risks, benefits and other options for treatment, the patient has consented to  Procedure(s): RIGHT ENDARTERECTOMY CAROTID (Right) as a surgical intervention.  The patient's history has been reviewed, patient examined, no change in status, stable for surgery.  I have reviewed the patient's chart and labs.  Questions were answered to the patient's satisfaction.     Ruta Hinds

## 2019-09-30 NOTE — Anesthesia Procedure Notes (Signed)
Arterial Line Insertion Start/End1/19/2021 7:10 AM, 09/30/2019 7:25 AM Performed by: Kyung Rudd, CRNA, CRNA  Patient location: Pre-op. Lidocaine 1% used for infiltration Left, radial was placed Catheter size: 20 G Hand hygiene performed  and maximum sterile barriers used   Attempts: 4 Procedure performed without using ultrasound guided technique. Following insertion, dressing applied and Biopatch. Post procedure assessment: normal  Patient tolerated the procedure well with no immediate complications.

## 2019-10-01 ENCOUNTER — Encounter: Payer: Self-pay | Admitting: *Deleted

## 2019-10-01 LAB — CBC
HCT: 34.5 % — ABNORMAL LOW (ref 36.0–46.0)
Hemoglobin: 11.8 g/dL — ABNORMAL LOW (ref 12.0–15.0)
MCH: 32 pg (ref 26.0–34.0)
MCHC: 34.2 g/dL (ref 30.0–36.0)
MCV: 93.5 fL (ref 80.0–100.0)
Platelets: 249 10*3/uL (ref 150–400)
RBC: 3.69 MIL/uL — ABNORMAL LOW (ref 3.87–5.11)
RDW: 11.9 % (ref 11.5–15.5)
WBC: 10.3 10*3/uL (ref 4.0–10.5)
nRBC: 0 % (ref 0.0–0.2)

## 2019-10-01 LAB — BASIC METABOLIC PANEL
Anion gap: 7 (ref 5–15)
BUN: 11 mg/dL (ref 8–23)
CO2: 26 mmol/L (ref 22–32)
Calcium: 8.6 mg/dL — ABNORMAL LOW (ref 8.9–10.3)
Chloride: 101 mmol/L (ref 98–111)
Creatinine, Ser: 0.72 mg/dL (ref 0.44–1.00)
GFR calc Af Amer: >60 mL/min (ref >60)
GFR calc non Af Amer: >60 mL/min (ref >60)
Glucose, Bld: 122 mg/dL — ABNORMAL HIGH (ref 70–99)
Potassium: 3.9 mmol/L (ref 3.5–5.1)
Sodium: 134 mmol/L — ABNORMAL LOW (ref 135–145)

## 2019-10-01 MED ORDER — HYDROCODONE-ACETAMINOPHEN 5-325 MG PO TABS: 1.0000 | ORAL_TABLET | ORAL | 0 refills | Status: DC | PRN

## 2019-10-01 NOTE — Progress Notes (Signed)
Discharge instructions given to Mrs. Doren.  Discussed new medications, medication changes and side effects.  Discussed follow up appointments.  Discussed activities until follow up with MD. Discussed signs and symptoms to watch for and when to contact the physician.  Verbalized understanding.

## 2019-10-01 NOTE — Discharge Instructions (Signed)
.  vvs  [facility]  Discharge Instructions   Carotid Endarterectomy (CEA)  Please refer to the following instructions for your post-procedure care. Your surgeon or physician assistant will discuss any changes with you.  Activity  You are encouraged to walk as much as you can. You can slowly return to normal activities but must avoid strenuous activity and heavy lifting until your doctor tell you it's OK. Avoid activities such as vacuuming or swinging a golf club. You can drive after one week if you are comfortable and you are no longer taking prescription pain medications. It is normal to feel tired for serval weeks after your surgery. It is also normal to have difficulty with sleep habits, eating, and bowel movements after surgery. These will go away with time.  Bathing/Showering  You may shower after you come home. Do not soak in a bathtub, hot tub, or swim until the incision heals completely.  Incision Care  Shower every day. Clean your incision with mild soap and water. Pat the area dry with a clean towel. You do not need a bandage unless otherwise instructed. Do not apply any ointments or creams to your incision. You may have skin glue on your incision. Do not peel it off. It will come off on its own in about one week. Your incision may feel thickened and raised for several weeks after your surgery. This is normal and the skin will soften over time. For Men Only: It's OK to shave around the incision but do not shave the incision itself for 2 weeks. It is common to have numbness under your chin that could last for several months.  Diet  Resume your normal diet. There are no special food restrictions following this procedure. A low fat/low cholesterol diet is recommended for all patients with vascular disease. In order to heal from your surgery, it is CRITICAL to get adequate nutrition. Your body requires vitamins, minerals, and protein. Vegetables are the  best source of vitamins and minerals. Vegetables also provide the perfect balance of protein. Processed food has little nutritional value, so try to avoid this.        Medications  Resume taking all of your medications unless your doctor or physician assistant tells you not to. If your incision is causing pain, you may take over-the- counter pain relievers such as acetaminophen (Tylenol). If you were prescribed a stronger pain medication, please be aware these medications can cause nausea and constipation. Prevent nausea by taking the medication with a snack or meal. Avoid constipation by drinking plenty of fluids and eating foods with a high amount of fiber, such as fruits, vegetables, and grains. Do not take Tylenol if you are taking prescription pain medications.  Follow Up  Our office will schedule a follow up appointment 2-3 weeks following discharge.  Please call us immediately for any of the following conditions  Increased pain, redness, drainage (pus) from your incision site. Fever of 101 degrees or higher. If you should develop stroke (slurred speech, difficulty swallowing, weakness on one side of your body, loss of vision) you should call 911 and go to the nearest emergency room.  Reduce your risk of vascular disease:  Stop smoking. If you would like help call [facility] at 1-800-QUIT-NOW 6101994580) or [place] at (506)788-9073. Manage your cholesterol Maintain a desired weight Control your diabetes Keep your blood pressure down  If you have any questions, please call the office at [phone].

## 2019-10-01 NOTE — Plan of Care (Signed)
  Problem: Education: Goal: Knowledge of General Education information will improve Description: Including pain rating scale, medication(s)/side effects and non-pharmacologic comfort measures Outcome: Adequate for Discharge   

## 2019-10-01 NOTE — Discharge Summary (Signed)
Discharge Summary     Kristin Byrd 09/18/1950 69 y.o. female  [id number]  Admission Date: 09/30/2019  Discharge Date: 10/01/2019 Physician: Elam Dutch, MD  Admission Diagnosis: Carotid stenosis, asymptomatic, right [I65.21]   HPI:   This is a 69 y.o. female with asymptomatic right internal carotid artery stenosis.  Hospital Course:  The patient was admitted to the hospital and taken to the operating room on 09/30/2019 and underwent right carotid endarterectomy.    Findings: 1 greater than 90% right internal carotid stenosis                                                             #2 Dacron patch                                 #3 8 Fr shunt  The pt tolerated the procedure well and was transported to the PACU in excellent condition.   By POD 1, the pt neuro status intact.  Her vital signs were stable.  She remained afebrile.  She had moderate edema of the right neck.  Her incision was well approximated.  There were no signs of hematoma.  Her pain was well controlled.  No headache, face asymmetry or dysphagia.  No extremity weakness.  Voiding spontaneously.  The remainder of the hospital course consisted of increasing mobilization and increasing intake of solids without difficulty.   Recent Labs    10/01/19 0207  NA 134*  K 3.9  CL 101  CO2 26  GLUCOSE 122*  BUN 11  CALCIUM 8.6*   Recent Labs    10/01/19 0207  WBC 10.3  HGB 11.8*  HCT 34.5*  PLT 249   No results for input(s): INR in the last 72 hours.   Discharge Instructions    Discharge patient   Complete by: As directed    Discharge disposition: 01-Home or Self Care   Discharge patient date: 10/01/2019      Discharge Diagnosis:  Carotid stenosis, asymptomatic, right [I65.21]  Secondary Diagnosis: Patient Active Problem List   Diagnosis Date Noted  . Carotid stenosis, asymptomatic, right 09/30/2019  . Obese 08/05/2014  . S/P left THA, AA 08/04/2014  . S/P right total hip  replacement 08/16/2011   Past Medical History:  Diagnosis Date  . Arthritis   . Diabetes mellitus   . Hypertension   . PONV (postoperative nausea and vomiting)     Allergies as of 10/01/2019      Reactions   Hydrocodone Itching   Oxycodone Itching   Shellfish Allergy Nausea And Vomiting   Sulfamethoxazole Nausea Only      Medication List    TAKE these medications   amLODipine 10 MG tablet Commonly known as: NORVASC Take 10 mg by mouth daily.   aspirin 81 MG EC tablet Take 81 mg by mouth at bedtime.   atorvastatin 10 MG tablet Commonly known as: LIPITOR Take 10 mg by mouth daily.   cetirizine 10 MG tablet Commonly known as: ZYRTEC Take 10 mg by mouth at bedtime.   diphenhydramine-acetaminophen 25-500 MG Tabs tablet Commonly known as: TYLENOL PM Take 1 tablet by mouth at bedtime.   fluticasone 50 MCG/ACT nasal spray Commonly known as: FLONASE Place 1-2  sprays into both nostrils daily as needed for allergies or rhinitis.   HYDROcodone-acetaminophen 5-325 MG tablet Commonly known as: NORCO/VICODIN Take 1 tablet by mouth every 4 (four) hours as needed for moderate pain.   Janumet 50-500 MG tablet Generic drug: sitaGLIPtin-metformin Take 2 tablets by mouth daily. SAMPLE   losartan-hydrochlorothiazide 50-12.5 MG tablet Commonly known as: HYZAAR Take 2 tablets by mouth daily.   metFORMIN 1000 MG tablet Commonly known as: GLUCOPHAGE Take 1,000 mg by mouth at bedtime.   montelukast 10 MG tablet Commonly known as: SINGULAIR Take 10 mg by mouth daily.   REFRESH 1 % ophthalmic solution Generic drug: carboxymethylcellulose Apply 1-2 drops to eye 3 (three) times daily as needed (DRY/IRRITATED EYES.).   Vitamin D 50 MCG (2000 UT) tablet Take 2,000 Units by mouth daily.   Vitamin D3 50 MCG (2000 UT) Tabs Take 2,000 Units by mouth.        [facility] Discharge Instructions Carotid Endarterectomy (CEA)  Please refer to the  following instructions for your post-procedure care. Your surgeon or physician assistant will discuss any changes with you.  Activity  You are encouraged to walk as much as you can. You can slowly return to normal activities but must avoid strenuous activity and heavy lifting until your doctor tell you it's OK. Avoid activities such as vacuuming or swinging a golf club. You can drive after one week if you are comfortable and you are no longer taking prescription pain medications. It is normal to feel tired for serval weeks after your surgery. It is also normal to have difficulty with sleep habits, eating, and bowel movements after surgery. These will go away with time.  Bathing/Showering  You may shower after you come home. Do not soak in a bathtub, hot tub, or swim until the incision heals completely.  Incision Care  Shower every day. Clean your incision with mild soap and water. Pat the area dry with a clean towel. You do not need a bandage unless otherwise instructed. Do not apply any ointments or creams to your incision. You may have skin glue on your incision. Do not peel it off. It will come off on its own in about one week. Your incision may feel thickened and raised for several weeks after your surgery. This is normal and the skin will soften over time. For Men Only: It's OK to shave around the incision but do not shave the incision itself for 2 weeks. It is common to have numbness under your chin that could last for several months.  Diet  Resume your normal diet. There are no special food restrictions following this procedure. A low fat/low cholesterol diet is recommended for all patients with vascular disease. In order to heal from your surgery, it is CRITICAL to get adequate nutrition. Your body requires vitamins, minerals, and protein. Vegetables are the best source of vitamins and minerals. Vegetables also provide the perfect balance of protein. Processed food has little nutritional  value, so try to avoid this.  Medications  Resume taking all of your medications unless your doctor or physician assistant tells you not to.  If your incision is causing pain, you may take over-the- counter pain relievers such as acetaminophen (Tylenol). If you were prescribed a stronger pain medication, please be aware these medications can cause nausea and constipation.  Prevent nausea by taking the medication with a snack or meal. Avoid constipation by drinking plenty of fluids and eating foods with a high amount  of fiber, such as fruits, vegetables, and grains.  Do not take Tylenol if you are taking prescription pain medications.  Follow Up  Our office will schedule a follow up appointment 2-3 weeks following discharge.  Please call us immediately for any of the following conditions  . Increased pain, redness, drainage (pus) from your incision site. . Fever of 101 degrees or higher. . If you should develop stroke (slurred speech, difficulty swallowing, weakness on one side of your body, loss of vision) you should call 911 and go to the nearest emergency room. .  Reduce your risk of vascular disease:  . Stop smoking. If you would like help call [facility] at 1-800-QUIT-NOW (570) 694-8553) or [place] at [phone]. . Manage your cholesterol . Maintain a desired weight . Control your diabetes . Keep your blood pressure down .  If you have any questions, please call the office at [phone].  Prescriptions given: Hydrocodone 5/325  Disposition: Home  Patient's condition: is Excellent  Follow up: 1. Dr. Oneida Alar in 2 weeks.   Risa Grill, PA-C [facility] [phone]   --- For [facility] use ---   Modified Rankin score at D/C (0-6): 0  IV medication needed for:  1. Hypertension: No 2. Hypotension: No  Post-op Complications: No  1. Post-op CVA or TIA: No  If yes: Event classification (right eye, left eye, right cortical, left  cortical, verterobasilar, other): n/a  If yes: Timing of event (intra-op, <6 hrs post-op, >=6 hrs post-op, unknown):   2. CN injury: No  If yes: CN n/a injuried   3. Myocardial infarction: No  If yes: Dx by (EKG or clinical, Troponin): n/a  4.  CHF: No  5.  Dysrhythmia (new): No  6. Wound infection: No  7. Reperfusion symptoms: No  8. Return to OR: No  If yes: return to OR for (bleeding, neurologic, other CEA incision, other): n/a  Discharge medications: Statin use:  Yes ASA use:  Yes   Beta blocker use:  No ACE-Inhibitor use:  No  ARB use:  No CCB use: Yes P2Y12 Antagonist use: No, [ ]  Plavix, [ ]  Plasugrel, [ ]  Ticlopinine, [ ]  Ticagrelor, [ ]  Other, [ ]  No for medical reason, [ ]  Non-compliant, [ ]  Not-indicated Anti-coagulant use:  No, [ ]  Warfarin, [ ]  Rivaroxaban, [ ]  Dabigatran,

## 2019-10-01 NOTE — Progress Notes (Addendum)
  Progress Note    10/01/2019 7:04 AM 1 Day Post-Op  Subjective: Postop day right CEA.  Minimal pain.  She has been out of bed to bedside toilet and is voiding spontaneously.  Tolerating diet without dysphagia    Vitals:   10/01/19 0237 10/01/19 0520  BP:  132/70  Pulse: 82 91  Resp:  12  Temp: 98.2 F (36.8 C) (!) 97.4 F (36.3 C)  SpO2:  92%    Physical Exam: General: Awake, alert and oriented x4.  Speech is clear.  No hoarseness Cardiac: Heart rate and rhythm are regular Lungs: There to auscultation bilaterally Incisions: Right neck incision with moderate edema.  Soft to palpation.  Incision well approximated Extremities: 5/5 bilateral grip strength Abdomen: Soft  CBC    Component Value Date/Time   WBC 10.3 10/01/2019 0207   RBC 3.69 (L) 10/01/2019 0207   HGB 11.8 (L) 10/01/2019 0207   HCT 34.5 (L) 10/01/2019 0207   PLT 249 10/01/2019 0207   MCV 93.5 10/01/2019 0207   MCH 32.0 10/01/2019 0207   MCHC 34.2 10/01/2019 0207   RDW 11.9 10/01/2019 0207    BMET    Component Value Date/Time   NA 134 (L) 10/01/2019 0207   K 3.9 10/01/2019 0207   CL 101 10/01/2019 0207   CO2 26 10/01/2019 0207   GLUCOSE 122 (H) 10/01/2019 0207   BUN 11 10/01/2019 0207   CREATININE 0.72 10/01/2019 0207   CALCIUM 8.6 (L) 10/01/2019 0207   GFRNONAA >60 10/01/2019 0207   GFRAA >60 10/01/2019 0207     Intake/Output Summary (Last 24 hours) at 10/01/2019 0704 Last data filed at 10/01/2019 0400 Gross per 24 hour  Intake 1800 ml  Output 800 ml  Net 1000 ml    HOSPITAL MEDICATIONS Scheduled Meds: . amLODipine  10 mg Oral Daily  . aspirin EC  81 mg Oral QHS  . atorvastatin  10 mg Oral Daily  . docusate sodium  100 mg Oral Daily  . losartan  50 mg Oral Daily   And  . hydrochlorothiazide  12.5 mg Oral Daily  . montelukast  10 mg Oral Daily  . pantoprazole  40 mg Oral Daily   Continuous Infusions: . sodium chloride    . sodium chloride 100 mL/hr at 09/30/19 1417  .  magnesium sulfate bolus IVPB     PRN Meds:.sodium chloride, acetaminophen **OR** acetaminophen, alum & mag hydroxide-simeth, bisacodyl, guaiFENesin-dextromethorphan, hydrALAZINE, labetalol, magnesium sulfate bolus IVPB, metoprolol tartrate, morphine injection, ondansetron, oxyCODONE-acetaminophen, phenol, polyethylene glycol, potassium chloride  Assessment:  69 y.o. female is s/p:  Right CEA.  She is remained hemodynamically stable.  Neurologically intact.   1 Day Post-Op  Plan: -Anticipate discharge home today -DVT prophylaxis:  SCDs   Risa Grill, PA-C [facility] [phone] 10/01/2019  7:04 AM   Agree with above patient will continue her aspirin and statin Minimal edema right neck no significant hematoma swallow intact no neurologic deficit  DC home today follow-up with me in 2 weeks  Ruta Hinds, MD [facility] [place] Office: [phone]

## 2019-10-13 DIAGNOSIS — H25813 Combined forms of age-related cataract, bilateral: Secondary | ICD-10-CM | POA: Diagnosis not present

## 2019-10-15 ENCOUNTER — Telehealth (HOSPITAL_COMMUNITY): Payer: Self-pay

## 2019-10-15 NOTE — Telephone Encounter (Signed)

## 2019-10-16 ENCOUNTER — Other Ambulatory Visit: Payer: Self-pay

## 2019-10-16 ENCOUNTER — Ambulatory Visit (INDEPENDENT_AMBULATORY_CARE_PROVIDER_SITE_OTHER): Payer: Self-pay | Admitting: Vascular Surgery

## 2019-10-16 VITALS — BP 138/74 | HR 100 | Temp 98.0°F | Resp 20 | Ht 69.0 in | Wt 210.0 lb

## 2019-10-16 DIAGNOSIS — I6521 Occlusion and stenosis of right carotid artery: Secondary | ICD-10-CM

## 2019-10-16 NOTE — Progress Notes (Signed)
Patient is a 69 year old female who returns today for postoperative follow-up after recent right carotid endarterectomy for asymptomatic greater than 80% stenosis.  She has had some pain in her right neck.  This is slightly improved.  She has also had some swelling in her right neck which also is slightly improved.  However, she is still having to take Tylenol for the pain and is using ice and warm pads to get symptomatic relief.  She does not have any symptoms of TIA amaurosis or stroke she is able to swallow well.  She has no tongue deviation.  Physical exam:  Vitals:   10/16/19 1409 10/16/19 1414  BP: (!) 142/78 138/74  Pulse: 100   Resp: 20   Temp: 98 F (36.7 C)   SpO2: 96%   Weight: 210 lb (95.3 kg)   Height: 5\' 9"  (1.753 m)     Neck: Some asymmetry with more swelling on the right side.  No incisional drainage or erythema.  Mass in the right neck is about 4 x 4 cm.  It is firm and nonfluctuant.  Assessment: Status post right carotid endarterectomy no neurologic symptoms but has right neck hematoma.  Patient has some mild bothersome symptoms of pain.  However in discussions with her she does not believe that these are severe enough that she would like to have evacuation of the hematoma.  She has no airway compromise.  Plan: The patient will follow up with me in 6 weeks time to make sure she has a complete resolution of the hematoma.  If she notices increasing pain symptoms problems with breathing or enlargement of the hematoma prior to this she will call me and we will make plans for evacuation of the hematoma.  Otherwise she will continue her aspirin and statin.  Of note she has no real significant carotid disease on the contralateral side so hopefully no further interventions at this point.  Ruta Hinds, MD [facility] [place] Office: [phone]

## 2019-10-17 ENCOUNTER — Other Ambulatory Visit: Payer: Self-pay

## 2019-10-17 ENCOUNTER — Other Ambulatory Visit: Payer: Self-pay | Admitting: Vascular Surgery

## 2019-10-17 ENCOUNTER — Emergency Department (HOSPITAL_COMMUNITY): Payer: PPO | Admitting: Registered Nurse

## 2019-10-17 ENCOUNTER — Inpatient Hospital Stay (HOSPITAL_COMMUNITY)
Admission: EM | Admit: 2019-10-17 | Discharge: 2019-10-23 | DRG: 908 | Disposition: A | Discharge status: Home Health | Payer: PPO | Attending: Vascular Surgery | Admitting: Vascular Surgery

## 2019-10-17 ENCOUNTER — Telehealth: Payer: Self-pay | Admitting: *Deleted

## 2019-10-17 ENCOUNTER — Encounter (HOSPITAL_COMMUNITY)
Admission: EM | Disposition: A | Discharge status: Home Health | Payer: Self-pay | Source: Home / Self Care | Attending: Vascular Surgery

## 2019-10-17 ENCOUNTER — Encounter (HOSPITAL_COMMUNITY): Payer: Self-pay | Admitting: *Deleted

## 2019-10-17 DIAGNOSIS — T8149XA Infection following a procedure, other surgical site, initial encounter: Secondary | ICD-10-CM | POA: Diagnosis present

## 2019-10-17 DIAGNOSIS — Y832 Surgical operation with anastomosis, bypass or graft as the cause of abnormal reaction of the patient, or of later complication, without mention of misadventure at the time of the procedure: Secondary | ICD-10-CM | POA: Diagnosis present

## 2019-10-17 DIAGNOSIS — Z79899 Other long term (current) drug therapy: Secondary | ICD-10-CM | POA: Diagnosis not present

## 2019-10-17 DIAGNOSIS — Z7982 Long term (current) use of aspirin: Secondary | ICD-10-CM | POA: Diagnosis not present

## 2019-10-17 DIAGNOSIS — Z882 Allergy status to sulfonamides status: Secondary | ICD-10-CM | POA: Diagnosis not present

## 2019-10-17 DIAGNOSIS — Z7984 Long term (current) use of oral hypoglycemic drugs: Secondary | ICD-10-CM

## 2019-10-17 DIAGNOSIS — Z20822 Contact with and (suspected) exposure to covid-19: Secondary | ICD-10-CM | POA: Diagnosis not present

## 2019-10-17 DIAGNOSIS — T8579XA Infection and inflammatory reaction due to other internal prosthetic devices, implants and grafts, initial encounter: Secondary | ICD-10-CM | POA: Diagnosis not present

## 2019-10-17 DIAGNOSIS — Z885 Allergy status to narcotic agent status: Secondary | ICD-10-CM

## 2019-10-17 DIAGNOSIS — T827XXA Infection and inflammatory reaction due to other cardiac and vascular devices, implants and grafts, initial encounter: Secondary | ICD-10-CM | POA: Diagnosis not present

## 2019-10-17 DIAGNOSIS — I10 Essential (primary) hypertension: Secondary | ICD-10-CM | POA: Diagnosis present

## 2019-10-17 DIAGNOSIS — Z823 Family history of stroke: Secondary | ICD-10-CM

## 2019-10-17 DIAGNOSIS — E119 Type 2 diabetes mellitus without complications: Secondary | ICD-10-CM | POA: Diagnosis not present

## 2019-10-17 DIAGNOSIS — I97638 Postprocedural hematoma of a circulatory system organ or structure following other circulatory system procedure: Secondary | ICD-10-CM | POA: Diagnosis not present

## 2019-10-17 DIAGNOSIS — Z96643 Presence of artificial hip joint, bilateral: Secondary | ICD-10-CM | POA: Diagnosis not present

## 2019-10-17 DIAGNOSIS — Z91013 Allergy to seafood: Secondary | ICD-10-CM

## 2019-10-17 DIAGNOSIS — B954 Other streptococcus as the cause of diseases classified elsewhere: Secondary | ICD-10-CM | POA: Diagnosis not present

## 2019-10-17 DIAGNOSIS — L7632 Postprocedural hematoma of skin and subcutaneous tissue following other procedure: Secondary | ICD-10-CM | POA: Diagnosis present

## 2019-10-17 DIAGNOSIS — T82898A Other specified complication of vascular prosthetic devices, implants and grafts, initial encounter: Secondary | ICD-10-CM | POA: Diagnosis not present

## 2019-10-17 DIAGNOSIS — Z419 Encounter for procedure for purposes other than remedying health state, unspecified: Secondary | ICD-10-CM

## 2019-10-17 HISTORY — PX: ENDARTERECTOMY: SHX5162

## 2019-10-17 LAB — RESPIRATORY PANEL BY RT PCR (FLU A&B, COVID)
Influenza A by PCR: NEGATIVE
Influenza B by PCR: NEGATIVE
SARS Coronavirus 2 by RT PCR: NEGATIVE

## 2019-10-17 SURGERY — ENDARTERECTOMY, CAROTID
Anesthesia: General | Laterality: Right

## 2019-10-17 MED ORDER — ONDANSETRON HCL 4 MG/2ML IJ SOLN
INTRAMUSCULAR | Status: AC
  Filled 2019-10-17: qty 2

## 2019-10-17 MED ORDER — FENTANYL CITRATE (PF) 250 MCG/5ML IJ SOLN
INTRAMUSCULAR | Status: DC | PRN
  Administered 2019-10-17 – 2019-10-18 (×9): 50 ug via INTRAVENOUS

## 2019-10-17 MED ORDER — SODIUM CHLORIDE 0.9 % IV SOLN
INTRAVENOUS | Status: AC
  Filled 2019-10-17: qty 500000

## 2019-10-17 MED ORDER — DEXAMETHASONE SODIUM PHOSPHATE 10 MG/ML IJ SOLN
INTRAMUSCULAR | Status: AC
  Filled 2019-10-17: qty 2

## 2019-10-17 MED ORDER — PHENYLEPHRINE 40 MCG/ML (10ML) SYRINGE FOR IV PUSH (FOR BLOOD PRESSURE SUPPORT)
PREFILLED_SYRINGE | INTRAVENOUS | Status: DC | PRN
  Administered 2019-10-17: 120 ug via INTRAVENOUS
  Administered 2019-10-17 (×3): 80 ug via INTRAVENOUS
  Administered 2019-10-18 (×6): 20 ug via INTRAVENOUS

## 2019-10-17 MED ORDER — PROPOFOL 10 MG/ML IV BOLUS
INTRAVENOUS | Status: AC
  Filled 2019-10-17: qty 20

## 2019-10-17 MED ORDER — FENTANYL CITRATE (PF) 250 MCG/5ML IJ SOLN
INTRAMUSCULAR | Status: AC
  Filled 2019-10-17: qty 5

## 2019-10-17 MED ORDER — DIPHENHYDRAMINE HCL 50 MG/ML IJ SOLN
INTRAMUSCULAR | Status: DC | PRN
  Administered 2019-10-17: 6.25 mg via INTRAVENOUS

## 2019-10-17 MED ORDER — ROCURONIUM BROMIDE 10 MG/ML (PF) SYRINGE
PREFILLED_SYRINGE | INTRAVENOUS | Status: AC
  Filled 2019-10-17: qty 10

## 2019-10-17 MED ORDER — MIDAZOLAM HCL 2 MG/2ML IJ SOLN
INTRAMUSCULAR | Status: AC
  Filled 2019-10-17: qty 2

## 2019-10-17 MED ORDER — SODIUM CHLORIDE 0.9% FLUSH
3.0000 mL | Freq: Once | INTRAVENOUS | Status: AC
  Administered 2019-10-17: 20:00:00 3 mL via INTRAVENOUS

## 2019-10-17 MED ORDER — LIDOCAINE 2% (20 MG/ML) 5 ML SYRINGE
INTRAMUSCULAR | Status: AC
  Filled 2019-10-17: qty 5

## 2019-10-17 MED ORDER — SODIUM CHLORIDE 0.9 % IV SOLN
INTRAVENOUS | Status: DC | PRN
  Administered 2019-10-17: 500 mL

## 2019-10-17 MED ORDER — LIDOCAINE 2% (20 MG/ML) 5 ML SYRINGE
INTRAMUSCULAR | Status: DC | PRN
  Administered 2019-10-17: 100 mg via INTRAVENOUS

## 2019-10-17 MED ORDER — PROPOFOL 10 MG/ML IV BOLUS
INTRAVENOUS | Status: DC | PRN
  Administered 2019-10-17: 150 mg via INTRAVENOUS
  Administered 2019-10-17: 20 mg via INTRAVENOUS
  Administered 2019-10-17: 50 mg via INTRAVENOUS
  Administered 2019-10-17: 20 mg via INTRAVENOUS

## 2019-10-17 MED ORDER — SUCCINYLCHOLINE CHLORIDE 200 MG/10ML IV SOSY
PREFILLED_SYRINGE | INTRAVENOUS | Status: AC
  Filled 2019-10-17: qty 10

## 2019-10-17 MED ORDER — DEXAMETHASONE SODIUM PHOSPHATE 10 MG/ML IJ SOLN
INTRAMUSCULAR | Status: DC | PRN
  Administered 2019-10-17: 5 mg via INTRAVENOUS

## 2019-10-17 MED ORDER — SUCCINYLCHOLINE CHLORIDE 200 MG/10ML IV SOSY
PREFILLED_SYRINGE | INTRAVENOUS | Status: DC | PRN
  Administered 2019-10-17: 120 mg via INTRAVENOUS

## 2019-10-17 MED ORDER — ROCURONIUM BROMIDE 10 MG/ML (PF) SYRINGE
PREFILLED_SYRINGE | INTRAVENOUS | Status: DC | PRN
  Administered 2019-10-17 (×3): 10 mg via INTRAVENOUS
  Administered 2019-10-17: 50 mg via INTRAVENOUS

## 2019-10-17 MED ORDER — LACTATED RINGERS IV SOLN: INTRAVENOUS | Status: DC | PRN

## 2019-10-17 MED ORDER — EPHEDRINE 5 MG/ML INJ
INTRAVENOUS | Status: AC
  Filled 2019-10-17: qty 10

## 2019-10-17 MED ORDER — PHENYLEPHRINE 40 MCG/ML (10ML) SYRINGE FOR IV PUSH (FOR BLOOD PRESSURE SUPPORT)
PREFILLED_SYRINGE | INTRAVENOUS | Status: AC
  Filled 2019-10-17: qty 10

## 2019-10-17 MED ORDER — 0.9 % SODIUM CHLORIDE (POUR BTL) OPTIME
TOPICAL | Status: DC | PRN
  Administered 2019-10-17: 22:00:00 5000 mL

## 2019-10-17 MED ORDER — PHENYLEPHRINE HCL (PRESSORS) 10 MG/ML IV SOLN
INTRAVENOUS | Status: AC
  Filled 2019-10-17: qty 1

## 2019-10-17 MED ORDER — SODIUM CHLORIDE 0.9 % IV SOLN
INTRAVENOUS | Status: AC
  Filled 2019-10-17: qty 1.2

## 2019-10-17 MED ORDER — CEFAZOLIN SODIUM-DEXTROSE 2-3 GM-%(50ML) IV SOLR
INTRAVENOUS | Status: DC | PRN
  Administered 2019-10-17: 2 g via INTRAVENOUS

## 2019-10-17 MED ORDER — HEPARIN SODIUM (PORCINE) 1000 UNIT/ML IJ SOLN
INTRAMUSCULAR | Status: DC | PRN
  Administered 2019-10-17: 10000 [IU] via INTRAVENOUS

## 2019-10-17 SURGICAL SUPPLY — 53 items
ADH SKN CLS APL DERMABOND .7 (GAUZE/BANDAGES/DRESSINGS) ×2
AGENT HMST SPONGE THK3/8 (HEMOSTASIS) ×1
CANISTER SUCT 3000ML PPV (MISCELLANEOUS) ×2 IMPLANT
CANNULA VESSEL 3MM 2 BLNT TIP (CANNULA) ×4 IMPLANT
CATH ROBINSON RED A/P 18FR (CATHETERS) ×4 IMPLANT
CLIP VESOCCLUDE MED 6/CT (CLIP) ×2 IMPLANT
CLIP VESOCCLUDE SM WIDE 6/CT (CLIP) ×2 IMPLANT
COVER WAND RF STERILE (DRAPES) IMPLANT
DECANTER SPIKE VIAL GLASS SM (MISCELLANEOUS) IMPLANT
DERMABOND ADVANCED (GAUZE/BANDAGES/DRESSINGS) ×2
DERMABOND ADVANCED .7 DNX12 (GAUZE/BANDAGES/DRESSINGS) ×2 IMPLANT
DRAIN CHANNEL 10M FLAT 3/4 FLT (DRAIN) ×2 IMPLANT
DRAIN HEMOVAC 1/8 X 5 (WOUND CARE) IMPLANT
DRAPE INCISE IOBAN 66X45 STRL (DRAPES) ×2 IMPLANT
ELECT REM PT RETURN 9FT ADLT (ELECTROSURGICAL) ×2
ELECTRODE REM PT RTRN 9FT ADLT (ELECTROSURGICAL) ×1 IMPLANT
EVACUATOR SILICONE 100CC (DRAIN) IMPLANT
GAUZE SPONGE 4X4 12PLY STRL LF (GAUZE/BANDAGES/DRESSINGS) ×2 IMPLANT
GLOVE BIO SURGEON STRL SZ7.5 (GLOVE) ×2 IMPLANT
GLOVE BIOGEL PI IND STRL 6.5 (GLOVE) ×1 IMPLANT
GLOVE BIOGEL PI IND STRL 7.5 (GLOVE) ×2 IMPLANT
GLOVE BIOGEL PI INDICATOR 6.5 (GLOVE) ×1
GLOVE BIOGEL PI INDICATOR 7.5 (GLOVE) ×2
GLOVE ECLIPSE 7.0 STRL STRAW (GLOVE) ×2 IMPLANT
GLOVE SURG SS PI 7.5 STRL IVOR (GLOVE) ×2 IMPLANT
GOWN STRL REUS W/ TWL LRG LVL3 (GOWN DISPOSABLE) ×3 IMPLANT
GOWN STRL REUS W/TWL LRG LVL3 (GOWN DISPOSABLE) ×6
HEMOSTAT SPONGE AVITENE ULTRA (HEMOSTASIS) ×2 IMPLANT
KIT BASIN OR (CUSTOM PROCEDURE TRAY) ×2 IMPLANT
KIT SHUNT ARGYLE CAROTID ART 6 (VASCULAR PRODUCTS) IMPLANT
KIT TURNOVER KIT B (KITS) ×2 IMPLANT
NEEDLE HYPO 25GX1X1/2 BEV (NEEDLE) IMPLANT
NS IRRIG 1000ML POUR BTL (IV SOLUTION) ×4 IMPLANT
PACK CAROTID (CUSTOM PROCEDURE TRAY) ×2 IMPLANT
PACK UNIVERSAL I (CUSTOM PROCEDURE TRAY) ×2 IMPLANT
PAD ARMBOARD 7.5X6 YLW CONV (MISCELLANEOUS) ×2 IMPLANT
POSITIONER HEAD DONUT 9IN (MISCELLANEOUS) IMPLANT
SHUNT CAROTID BYPASS 10 (VASCULAR PRODUCTS) IMPLANT
SHUNT CAROTID BYPASS 12FRX15.5 (VASCULAR PRODUCTS) IMPLANT
SUT ETHILON 3 0 PS 1 (SUTURE) ×2 IMPLANT
SUT PROLENE 6 0 CC (SUTURE) ×4 IMPLANT
SUT SILK 3 0 TIES 17X18 (SUTURE)
SUT SILK 3-0 18XBRD TIE BLK (SUTURE) IMPLANT
SUT VIC AB 2-0 SH 27 (SUTURE) ×2
SUT VIC AB 2-0 SH 27XBRD (SUTURE) ×1 IMPLANT
SUT VIC AB 3-0 SH 27 (SUTURE) ×4
SUT VIC AB 3-0 SH 27X BRD (SUTURE) ×2 IMPLANT
SUT VICRYL 4-0 PS2 18IN ABS (SUTURE) ×4 IMPLANT
SYR BULB IRRIGATION 50ML (SYRINGE) ×2 IMPLANT
SYR CONTROL 10ML LL (SYRINGE) IMPLANT
TAPE CLOTH SURG 4X10 WHT LF (GAUZE/BANDAGES/DRESSINGS) ×2 IMPLANT
TOWEL GREEN STERILE (TOWEL DISPOSABLE) ×4 IMPLANT
WATER STERILE IRR 1000ML POUR (IV SOLUTION) ×2 IMPLANT

## 2019-10-17 NOTE — H&P (Signed)
Patient name: Kristin Byrd MRN: [id number] DOB: 10/25/1950 Sex: female  HPI: Kristin Byrd is a 69 y.o. female who is status post right carotid endarterectomy 17 days ago.  She was seen in the office yesterday and had some fullness and swelling in her right neck thought to be hematoma.  Over the last 24 hours her neck has become more red swollen and tender and apparently drained some purulent material today.  She has had no symptoms of TIA amaurosis or stroke.  She is currently on aspirin and a statin.  Past Medical History:  Diagnosis Date  . Arthritis   . Diabetes mellitus   . Hypertension   . PONV (postoperative nausea and vomiting)    Past Surgical History:  Procedure Laterality Date  . CHOLECYSTECTOMY    . DENTAL SURGERY    . DILATION AND CURETTAGE OF UTERUS    . ENDARTERECTOMY Right 09/30/2019   Procedure: RIGHT ENDARTERECTOMY CAROTID;  Surgeon: Elam Dutch, MD;  Location: [place];  Service: Vascular;  Laterality: Right;  . EYE SURGERY     per pt lasik eye surgery bilateral on 09/26/19  . L WIRST     CYST REMOVED  . PATCH ANGIOPLASTY Right 09/30/2019   Procedure: [place];  Surgeon: Elam Dutch, MD;  Location: [facility];  Service: Vascular;  Laterality: Right;  . TOTAL HIP ARTHROPLASTY  08/15/2011   Procedure: TOTAL HIP ARTHROPLASTY ANTERIOR APPROACH;  Surgeon: Mauri Pole;  Location: [facility];  Service: Orthopedics;  Laterality: Right;  . TOTAL HIP ARTHROPLASTY Left 08/04/2014   Procedure: LEFT TOTAL HIP ARTHROPLASTY ANTERIOR APPROACH;  Surgeon: Mauri Pole, MD;  Location: [facility];  Service: Orthopedics;  Laterality: Left;    Family History  Problem Relation Age of Onset  . Stroke Father     SOCIAL HISTORY: Social History   Socioeconomic History  . Marital status: Married    Spouse name: Not on file  . Number of children: Not on file  . Years of education: Not on file  . Highest education  level: Not on file  Occupational History  . Not on file  Tobacco Use  . Smoking status: Never Smoker  . Smokeless tobacco: Never Used  Substance and Sexual Activity  . Alcohol use: Yes    Comment: rarely  . Drug use: No  . Sexual activity: Yes  Other Topics Concern  . Not on file  Social History Narrative  . Not on file   Social Determinants of Health   Financial Resource Strain:   . Difficulty of Paying Living Expenses: Not on file  Food Insecurity:   . Worried About Charity fundraiser in the Last Year: Not on file  . Ran Out of Food in the Last Year: Not on file  Transportation Needs:   . Lack of Transportation (Medical): Not on file  . Lack of Transportation (Non-Medical): Not on file  Physical Activity:   . Days of Exercise per Week: Not on file  . Minutes of Exercise per Session: Not on file  Stress:   . Feeling of Stress : Not on file  Social Connections:   . Frequency of Communication with Friends and Family: Not on file  . Frequency of Social Gatherings with Friends and Family: Not on file  . Attends Religious Services: Not on file  . Active Member of Clubs or Organizations: Not on file  . Attends Archivist Meetings:  Not on file  . Marital Status: Not on file  Intimate Partner Violence:   . Fear of Current or Ex-Partner: Not on file  . Emotionally Abused: Not on file  . Physically Abused: Not on file  . Sexually Abused: Not on file    Allergies  Allergen Reactions  . Hydrocodone Itching  . Oxycodone Itching  . Shellfish Allergy Nausea And Vomiting  . Sulfamethoxazole Nausea Only    No current facility-administered medications for this encounter.   Current Outpatient Medications  Medication Sig Dispense Refill  . amLODipine (NORVASC) 10 MG tablet Take 10 mg by mouth daily.     Marland Kitchen aspirin 81 MG EC tablet Take 81 mg by mouth at bedtime.     Marland Kitchen atorvastatin (LIPITOR) 10 MG tablet Take 10 mg by mouth daily.     . carboxymethylcellulose  (REFRESH) 1 % ophthalmic solution Apply 1-2 drops to eye 3 (three) times daily as needed (DRY/IRRITATED EYES.).     Marland Kitchen cetirizine (ZYRTEC) 10 MG tablet Take 10 mg by mouth at bedtime.     . Cholecalciferol (VITAMIN D) 2000 UNITS tablet Take 2,000 Units by mouth daily.     . Cholecalciferol (VITAMIN D3) 50 MCG (2000 UT) TABS Take 2,000 Units by mouth.    . diphenhydramine-acetaminophen (TYLENOL PM) 25-500 MG TABS tablet Take 1 tablet by mouth at bedtime.    . fluticasone (FLONASE) 50 MCG/ACT nasal spray Place 1-2 sprays into both nostrils daily as needed for allergies or rhinitis.    Marland Kitchen HYDROcodone-acetaminophen (NORCO/VICODIN) 5-325 MG tablet Take 1 tablet by mouth every 4 (four) hours as needed for moderate pain. 5 tablet 0  . losartan-hydrochlorothiazide (HYZAAR) 100-25 MG tablet Take 1 tablet by mouth daily.    . metFORMIN (GLUCOPHAGE) 1000 MG tablet Take 1,000 mg by mouth at bedtime.     . montelukast (SINGULAIR) 10 MG tablet Take 10 mg by mouth daily.     . sitaGLIPtin-metformin (JANUMET) 50-500 MG tablet Take 2 tablets by mouth daily. SAMPLE      Physical Examination  Vitals:   10/17/19 1915  BP: (!) 159/79  Pulse: (!) 114  Resp: 16  Temp: 99.3 F (37.4 C)  TempSrc: Oral  SpO2: 100%    There is no height or weight on file to calculate BMI.  General:  Alert and oriented, no acute distress HEENT: Normal Neck: Erythema surrounding the right neck incision extending across the midline of the neck some maceration of the skin still with fullness in the right neck no obvious drainage currently, no stridor no airway compromise Pulmonary: Clear to auscultation bilaterally Cardiac: Regular Rate and Rhythm Neurologic: Upper and lower extremity motor 5/5 and symmetric  ASSESSMENT: Hematoma right neck now possibly infected   PLAN: I&D right neck possible removal of dacron patch and replacement with vein patch.  Risk benefits possible complications and procedure details were discussed with  patient including not limited to bleeding infection primarily risk of cranial nerve injury stroke.  She wishes to proceed.   Ruta Hinds, MD [facility] [place] Office: [phone] Pager: [phone]

## 2019-10-17 NOTE — Telephone Encounter (Signed)
Patient called requesting a refill on hydrocodone.  She was seen in the office Thursday, 10/16/2019 by Dr Oneida Alar.  Patient states that she has #5 hydrocodone that was given to her upon discharge 10/31/2019 and has been managing her pain with tylenol.   Patient is c/o a hematoma, she has been applying ice to the area.  The phone call was made at 3:21 pm from the patient.  After discussing symptoms with Elliot Dally.  PA, I explained to the patient if she is experiencing that much pain this far out from surgery she should report to the ED.  Patient acknowledged understanding of the instructions.

## 2019-10-17 NOTE — ED Triage Notes (Signed)
Pt arrives post carotid endartectomy by Dr. Oneida Alar on the 19th. Pt has increased swelling and redness to the right neck at surgical site.  Reports onset of purulent drainage today. No difficulty swallowing or fevers. Neuro intact. Dr. Oneida Alar made aware of pt arrival in the ED, to see patient in the ED to take to surgery. Took tylenol around 5 pm.

## 2019-10-17 NOTE — Anesthesia Procedure Notes (Signed)
Procedure Name: Intubation Date/Time: 10/17/2019 9:43 PM Performed by: Jearld Pies, CRNA Pre-anesthesia Checklist: Patient identified, Emergency Drugs available, Suction available and Patient being monitored Patient Re-evaluated:Patient Re-evaluated prior to induction Oxygen Delivery Method: Circle System Utilized Preoxygenation: Pre-oxygenation with 100% oxygen Induction Type: IV induction, Rapid sequence and Cricoid Pressure applied Laryngoscope Size: Glidescope and 4 Grade View: Grade I Tube type: Oral Tube size: 7.0 mm Number of attempts: 1 Airway Equipment and Method: Stylet and Video-laryngoscopy Placement Confirmation: ETT inserted through vocal cords under direct vision,  positive ETCO2 and breath sounds checked- equal and bilateral Secured at: 22 cm Tube secured with: Tape Dental Injury: Teeth and Oropharynx as per pre-operative assessment  Comments: Glidescope utilized d/t neck hematoma.

## 2019-10-17 NOTE — Anesthesia Preprocedure Evaluation (Addendum)
Anesthesia Evaluation  Patient identified by MRN, date of birth, ID band Patient awake    Reviewed: Allergy & Precautions, NPO status , Patient's Chart, lab work & pertinent test results  History of Anesthesia Complications (+) PONV  Airway Mallampati: II  TM Distance: >3 FB     Dental   Pulmonary neg pulmonary ROS,  R neck swelling. Denies SOB or trouble talking or breathing. CG   breath sounds clear to auscultation       Cardiovascular hypertension,  Rhythm:Regular Rate:Normal     Neuro/Psych negative neurological ROS     GI/Hepatic negative GI ROS, Neg liver ROS,   Endo/Other  diabetes  Renal/GU negative Renal ROS     Musculoskeletal  (+) Arthritis ,   Abdominal   Peds  Hematology   Anesthesia Other Findings   Reproductive/Obstetrics                            Anesthesia Physical Anesthesia Plan  ASA: III  Anesthesia Plan: General   Post-op Pain Management:    Induction: Intravenous  PONV Risk Score and Plan: 4 or greater and Ondansetron, Dexamethasone and Midazolam  Airway Management Planned: Oral ETT and Video Laryngoscope Planned  Additional Equipment:   Intra-op Plan:   Post-operative Plan:   Informed Consent: I have reviewed the patients History and Physical, chart, labs and discussed the procedure including the risks, benefits and alternatives for the proposed anesthesia with the patient or authorized representative who has indicated his/her understanding and acceptance.     Dental advisory given  Plan Discussed with: CRNA and Anesthesiologist  Anesthesia Plan Comments:         Anesthesia Quick Evaluation

## 2019-10-17 NOTE — ED Notes (Signed)
Patient arrived POV with a note to go to Short Stay. Pt is to have surgery with Ruta Hinds, Md.Spoke with Marge in [place] and she states that [facility] in currently in case and that we need to hold the patient in the ED until they send for her. [facility] OR nurse called back for page to confirm pt to have surgery tonight.

## 2019-10-17 NOTE — ED Notes (Signed)
Pt only needs covid swabs, no labs per dr. Oneida Alar

## 2019-10-17 NOTE — Anesthesia Procedure Notes (Signed)
Arterial Line Insertion Start/End2/01/2020 9:50 PM, 10/17/2019 10:02 PM Performed by: Jearld Pies, CRNA, CRNA  Patient location: OR. Preanesthetic checklist: patient identified, IV checked, site marked, risks and benefits discussed, surgical consent, monitors and equipment checked, pre-op evaluation, timeout performed and anesthesia consent Right, radial was placed Catheter size: 20 G Hand hygiene performed , maximum sterile barriers used  and Seldinger technique used Allen's test indicative of satisfactory collateral circulation Attempts: 3 Procedure performed without using ultrasound guided technique. Following insertion, dressing applied and Biopatch. Post procedure assessment: normal  Patient tolerated the procedure well with no immediate complications. Additional procedure comments: Attempted x 2 by previous CRNA, obtained arterial access post anesthesia induction.Kristin Byrd

## 2019-10-17 NOTE — ED Notes (Signed)
Per OR, pt needs to be registered in ED before they can accept pt. This RN requested registration to see pt, advised that COVID swab needs to be sent down & in process with Lab.

## 2019-10-18 ENCOUNTER — Inpatient Hospital Stay: Payer: Self-pay

## 2019-10-18 DIAGNOSIS — T827XXA Infection and inflammatory reaction due to other cardiac and vascular devices, implants and grafts, initial encounter: Secondary | ICD-10-CM

## 2019-10-18 DIAGNOSIS — T8149XA Infection following a procedure, other surgical site, initial encounter: Secondary | ICD-10-CM | POA: Diagnosis present

## 2019-10-18 LAB — CBC
HCT: 31.3 % — ABNORMAL LOW (ref 36.0–46.0)
Hemoglobin: 10.7 g/dL — ABNORMAL LOW (ref 12.0–15.0)
MCH: 31.6 pg (ref 26.0–34.0)
MCHC: 34.2 g/dL (ref 30.0–36.0)
MCV: 92.3 fL (ref 80.0–100.0)
Platelets: 289 10*3/uL (ref 150–400)
RBC: 3.39 MIL/uL — ABNORMAL LOW (ref 3.87–5.11)
RDW: 11.6 % (ref 11.5–15.5)
WBC: 10.8 10*3/uL — ABNORMAL HIGH (ref 4.0–10.5)
nRBC: 0 % (ref 0.0–0.2)

## 2019-10-18 LAB — GLUCOSE, CAPILLARY
Glucose-Capillary: 208 mg/dL — ABNORMAL HIGH (ref 70–99)
Glucose-Capillary: 197 mg/dL — ABNORMAL HIGH (ref 70–99)
Glucose-Capillary: 205 mg/dL — ABNORMAL HIGH (ref 70–99)
Glucose-Capillary: 142 mg/dL — ABNORMAL HIGH (ref 70–99)
Glucose-Capillary: 140 mg/dL — ABNORMAL HIGH (ref 70–99)
Glucose-Capillary: 100 mg/dL — ABNORMAL HIGH (ref 70–99)

## 2019-10-18 LAB — BASIC METABOLIC PANEL
Anion gap: 11 (ref 5–15)
BUN: 11 mg/dL (ref 8–23)
CO2: 22 mmol/L (ref 22–32)
Calcium: 8.4 mg/dL — ABNORMAL LOW (ref 8.9–10.3)
Chloride: 96 mmol/L — ABNORMAL LOW (ref 98–111)
Creatinine, Ser: 0.64 mg/dL (ref 0.44–1.00)
GFR calc Af Amer: >60 mL/min (ref >60)
GFR calc non Af Amer: >60 mL/min (ref >60)
Glucose, Bld: 229 mg/dL — ABNORMAL HIGH (ref 70–99)
Potassium: 3.7 mmol/L (ref 3.5–5.1)
Sodium: 129 mmol/L — ABNORMAL LOW (ref 135–145)

## 2019-10-18 LAB — HEMOGLOBIN A1C
Hgb A1c MFr Bld: 6.2 % — ABNORMAL HIGH (ref 4.8–5.6)
Mean Plasma Glucose: 131.24 mg/dL

## 2019-10-18 MED ORDER — PHENOL 1.4 % MT LIQD: 1.0000 | OROMUCOSAL | Status: DC | PRN

## 2019-10-18 MED ORDER — MONTELUKAST SODIUM 10 MG PO TABS
10.0000 mg | ORAL_TABLET | Freq: Every day | ORAL | Status: DC
  Administered 2019-10-18 – 2019-10-23 (×6): 10 mg via ORAL
  Filled 2019-10-18 (×6): qty 1

## 2019-10-18 MED ORDER — MAGNESIUM SULFATE 2 GM/50ML IV SOLN: 2.0000 g | Freq: Every day | INTRAVENOUS | Status: DC | PRN

## 2019-10-18 MED ORDER — FLUTICASONE PROPIONATE 50 MCG/ACT NA SUSP: 1.0000 | Freq: Every day | NASAL | Status: DC | PRN

## 2019-10-18 MED ORDER — METOPROLOL TARTRATE 5 MG/5ML IV SOLN: 2.0000 mg | INTRAVENOUS | Status: DC | PRN

## 2019-10-18 MED ORDER — DIPHENHYDRAMINE-APAP (SLEEP) 25-500 MG PO TABS: 1.0000 | ORAL_TABLET | Freq: Every day | ORAL | Status: DC

## 2019-10-18 MED ORDER — FLEET ENEMA 7-19 GM/118ML RE ENEM: 1.0000 | ENEMA | Freq: Once | RECTAL | Status: DC | PRN

## 2019-10-18 MED ORDER — PANTOPRAZOLE SODIUM 40 MG PO TBEC
40.0000 mg | DELAYED_RELEASE_TABLET | Freq: Every day | ORAL | Status: DC
  Administered 2019-10-18 – 2019-10-23 (×6): 40 mg via ORAL
  Filled 2019-10-18 (×6): qty 1

## 2019-10-18 MED ORDER — HEMOSTATIC AGENTS (NO CHARGE) OPTIME
TOPICAL | Status: DC | PRN
  Administered 2019-10-18: 1 via TOPICAL

## 2019-10-18 MED ORDER — LOSARTAN POTASSIUM-HCTZ 100-25 MG PO TABS: 1.0000 | ORAL_TABLET | Freq: Every day | ORAL | Status: DC

## 2019-10-18 MED ORDER — INSULIN ASPART 100 UNIT/ML ~~LOC~~ SOLN
0.0000 [IU] | Freq: Three times a day (TID) | SUBCUTANEOUS | Status: DC
  Administered 2019-10-18 (×2): 1 [IU] via SUBCUTANEOUS
  Administered 2019-10-18: 07:00:00 2 [IU] via SUBCUTANEOUS
  Administered 2019-10-19: 13:00:00 1 [IU] via SUBCUTANEOUS

## 2019-10-18 MED ORDER — ONDANSETRON HCL 4 MG/2ML IJ SOLN
INTRAMUSCULAR | Status: DC | PRN
  Administered 2019-10-18: 4 mg via INTRAVENOUS

## 2019-10-18 MED ORDER — LINAGLIPTIN 5 MG PO TABS
5.0000 mg | ORAL_TABLET | Freq: Every day | ORAL | Status: DC
  Administered 2019-10-18 – 2019-10-23 (×6): 5 mg via ORAL
  Filled 2019-10-18 (×6): qty 1

## 2019-10-18 MED ORDER — ONDANSETRON HCL 4 MG/2ML IJ SOLN
4.0000 mg | Freq: Four times a day (QID) | INTRAMUSCULAR | Status: DC | PRN
  Administered 2019-10-22: 4 mg via INTRAVENOUS
  Filled 2019-10-18: qty 2

## 2019-10-18 MED ORDER — SODIUM CHLORIDE 0.9 % IV SOLN: 500.0000 mL | Freq: Once | INTRAVENOUS | Status: DC | PRN

## 2019-10-18 MED ORDER — VANCOMYCIN HCL IN DEXTROSE 1-5 GM/200ML-% IV SOLN
1000.0000 mg | Freq: Two times a day (BID) | INTRAVENOUS | Status: DC
  Administered 2019-10-18: 1000 mg via INTRAVENOUS
  Filled 2019-10-18 (×2): qty 200

## 2019-10-18 MED ORDER — SUGAMMADEX SODIUM 200 MG/2ML IV SOLN
INTRAVENOUS | Status: DC | PRN
  Administered 2019-10-18 (×2): 100 mg via INTRAVENOUS

## 2019-10-18 MED ORDER — ATORVASTATIN CALCIUM 10 MG PO TABS
10.0000 mg | ORAL_TABLET | Freq: Every day | ORAL | Status: DC
  Administered 2019-10-18 – 2019-10-22 (×5): 10 mg via ORAL
  Filled 2019-10-18 (×5): qty 1

## 2019-10-18 MED ORDER — METFORMIN HCL 500 MG PO TABS
1000.0000 mg | ORAL_TABLET | Freq: Every day | ORAL | Status: DC
  Administered 2019-10-18 – 2019-10-23 (×6): 1000 mg via ORAL
  Filled 2019-10-18 (×6): qty 2

## 2019-10-18 MED ORDER — METFORMIN HCL 500 MG PO TABS
1000.0000 mg | ORAL_TABLET | Freq: Every day | ORAL | Status: DC
  Administered 2019-10-18 – 2019-10-22 (×5): 1000 mg via ORAL
  Filled 2019-10-18 (×5): qty 2

## 2019-10-18 MED ORDER — HYDROCODONE-ACETAMINOPHEN 5-325 MG PO TABS: 1.0000 | ORAL_TABLET | ORAL | Status: DC | PRN

## 2019-10-18 MED ORDER — SENNOSIDES-DOCUSATE SODIUM 8.6-50 MG PO TABS
1.0000 | ORAL_TABLET | Freq: Every evening | ORAL | Status: DC | PRN
  Administered 2019-10-20: 1 via ORAL
  Filled 2019-10-18: qty 1

## 2019-10-18 MED ORDER — LABETALOL HCL 5 MG/ML IV SOLN
INTRAVENOUS | Status: DC | PRN
  Administered 2019-10-18 (×2): 5 mg via INTRAVENOUS

## 2019-10-18 MED ORDER — LABETALOL HCL 5 MG/ML IV SOLN: 10.0000 mg | INTRAVENOUS | Status: DC | PRN

## 2019-10-18 MED ORDER — CARBOXYMETHYLCELLULOSE SODIUM 1 % OP SOLN: 1.0000 [drp] | Freq: Three times a day (TID) | OPHTHALMIC | Status: DC | PRN

## 2019-10-18 MED ORDER — ACETAMINOPHEN 325 MG PO TABS
325.0000 mg | ORAL_TABLET | ORAL | Status: DC | PRN
  Administered 2019-10-18 – 2019-10-19 (×4): 650 mg via ORAL
  Administered 2019-10-19: 500 mg via ORAL
  Administered 2019-10-20 – 2019-10-21 (×4): 650 mg via ORAL
  Filled 2019-10-18 (×11): qty 2

## 2019-10-18 MED ORDER — GUAIFENESIN-DM 100-10 MG/5ML PO SYRP: 15.0000 mL | ORAL_SOLUTION | ORAL | Status: DC | PRN

## 2019-10-18 MED ORDER — ACETAMINOPHEN 500 MG PO TABS
500.0000 mg | ORAL_TABLET | Freq: Every evening | ORAL | Status: DC | PRN
  Administered 2019-10-22: 500 mg via ORAL
  Filled 2019-10-18 (×3): qty 1

## 2019-10-18 MED ORDER — HYDROCHLOROTHIAZIDE 25 MG PO TABS
25.0000 mg | ORAL_TABLET | Freq: Every day | ORAL | Status: DC
  Administered 2019-10-18 – 2019-10-23 (×6): 25 mg via ORAL
  Filled 2019-10-18 (×6): qty 1

## 2019-10-18 MED ORDER — LOSARTAN POTASSIUM 50 MG PO TABS
100.0000 mg | ORAL_TABLET | Freq: Every day | ORAL | Status: DC
  Administered 2019-10-18 – 2019-10-23 (×6): 100 mg via ORAL
  Filled 2019-10-18 (×7): qty 2

## 2019-10-18 MED ORDER — SITAGLIPTIN PHOS-METFORMIN HCL 50-500 MG PO TABS: 2.0000 | ORAL_TABLET | Freq: Every day | ORAL | Status: DC

## 2019-10-18 MED ORDER — ESMOLOL HCL 100 MG/10ML IV SOLN
INTRAVENOUS | Status: DC | PRN
  Administered 2019-10-18 (×2): 10 ug via INTRAVENOUS

## 2019-10-18 MED ORDER — PIPERACILLIN-TAZOBACTAM 3.375 G IVPB
3.3750 g | Freq: Three times a day (TID) | INTRAVENOUS | Status: DC
  Administered 2019-10-18 – 2019-10-20 (×5): 3.375 g via INTRAVENOUS
  Filled 2019-10-18 (×4): qty 50

## 2019-10-18 MED ORDER — PROTAMINE SULFATE 10 MG/ML IV SOLN
INTRAVENOUS | Status: DC | PRN
  Administered 2019-10-18 (×2): 20 mg via INTRAVENOUS
  Administered 2019-10-18: 10 mg via INTRAVENOUS
  Administered 2019-10-18: 20 mg via INTRAVENOUS
  Administered 2019-10-18: 10 mg via INTRAVENOUS
  Administered 2019-10-18: 20 mg via INTRAVENOUS

## 2019-10-18 MED ORDER — VANCOMYCIN HCL IN DEXTROSE 1-5 GM/200ML-% IV SOLN
1000.0000 mg | Freq: Two times a day (BID) | INTRAVENOUS | Status: DC
  Administered 2019-10-18 – 2019-10-20 (×4): 1000 mg via INTRAVENOUS
  Filled 2019-10-18 (×4): qty 200

## 2019-10-18 MED ORDER — FENTANYL CITRATE (PF) 100 MCG/2ML IJ SOLN: 25.0000 ug | INTRAMUSCULAR | Status: DC | PRN

## 2019-10-18 MED ORDER — ASPIRIN EC 81 MG PO TBEC
81.0000 mg | DELAYED_RELEASE_TABLET | Freq: Every day | ORAL | Status: DC
  Administered 2019-10-18 – 2019-10-22 (×5): 81 mg via ORAL
  Filled 2019-10-18 (×5): qty 1

## 2019-10-18 MED ORDER — HEPARIN SODIUM (PORCINE) 5000 UNIT/ML IJ SOLN
5000.0000 [IU] | Freq: Three times a day (TID) | INTRAMUSCULAR | Status: DC
  Administered 2019-10-19 – 2019-10-22 (×12): 5000 [IU] via SUBCUTANEOUS
  Filled 2019-10-18 (×12): qty 1

## 2019-10-18 MED ORDER — POTASSIUM CHLORIDE CRYS ER 20 MEQ PO TBCR: 20.0000 meq | EXTENDED_RELEASE_TABLET | Freq: Every day | ORAL | Status: DC | PRN

## 2019-10-18 MED ORDER — POLYVINYL ALCOHOL 1.4 % OP SOLN: 1.0000 [drp] | Freq: Three times a day (TID) | OPHTHALMIC | Status: DC | PRN

## 2019-10-18 MED ORDER — HYDRALAZINE HCL 20 MG/ML IJ SOLN: 5.0000 mg | INTRAMUSCULAR | Status: DC | PRN

## 2019-10-18 MED ORDER — DOCUSATE SODIUM 100 MG PO CAPS
100.0000 mg | ORAL_CAPSULE | Freq: Every day | ORAL | Status: DC
  Administered 2019-10-19 – 2019-10-23 (×5): 100 mg via ORAL
  Filled 2019-10-18 (×5): qty 1

## 2019-10-18 MED ORDER — PROPOFOL 10 MG/ML IV BOLUS
INTRAVENOUS | Status: AC
  Filled 2019-10-18: qty 20

## 2019-10-18 MED ORDER — DIPHENHYDRAMINE HCL 25 MG PO CAPS
25.0000 mg | ORAL_CAPSULE | Freq: Every evening | ORAL | Status: DC | PRN
  Administered 2019-10-20 – 2019-10-22 (×2): 25 mg via ORAL
  Filled 2019-10-18 (×2): qty 1

## 2019-10-18 MED ORDER — ACETAMINOPHEN 325 MG RE SUPP: 325.0000 mg | RECTAL | Status: DC | PRN

## 2019-10-18 MED ORDER — HYDROMORPHONE HCL 1 MG/ML IJ SOLN: 0.5000 mg | INTRAMUSCULAR | Status: DC | PRN

## 2019-10-18 MED ORDER — ALUM & MAG HYDROXIDE-SIMETH 200-200-20 MG/5ML PO SUSP: 15.0000 mL | ORAL | Status: DC | PRN

## 2019-10-18 MED ORDER — PHENYLEPHRINE HCL-NACL 10-0.9 MG/250ML-% IV SOLN
INTRAVENOUS | Status: DC | PRN
  Administered 2019-10-18: 25 ug/min via INTRAVENOUS

## 2019-10-18 MED ORDER — BISACODYL 5 MG PO TBEC: 5.0000 mg | DELAYED_RELEASE_TABLET | Freq: Every day | ORAL | Status: DC | PRN

## 2019-10-18 MED ORDER — PIPERACILLIN-TAZOBACTAM 3.375 G IVPB
3.3750 g | Freq: Four times a day (QID) | INTRAVENOUS | Status: DC
  Administered 2019-10-18 (×2): 3.375 g via INTRAVENOUS
  Filled 2019-10-18 (×2): qty 50

## 2019-10-18 MED ORDER — SODIUM CHLORIDE 0.9 % IV SOLN: INTRAVENOUS | Status: DC

## 2019-10-18 MED ORDER — AMLODIPINE BESYLATE 10 MG PO TABS
10.0000 mg | ORAL_TABLET | Freq: Every day | ORAL | Status: DC
  Administered 2019-10-18 – 2019-10-23 (×6): 10 mg via ORAL
  Filled 2019-10-18 (×6): qty 1

## 2019-10-18 NOTE — Op Note (Signed)
Procedure: Removal of Dacron patch, incision and drainage right neck, vein patch angioplasty right carotid artery, harvest of right greater saphenous vein  Preoperative diagnosis: Infected carotid artery post carotid endarterectomy  Postoperative diagnosis: Same  Anesthesia: General  Assistant: Gerri Lins, PA-C  Indications: Patient is postoperative day #17 from a right carotid endarterectomy.  She developed a hematoma postoperatively.  Yesterday she began to have foul-smelling drainage from the incision.  Operative findings: #1 watery yellowish drainage right neck with severely inflamed tissues  2.  Vein patch extending from the common carotid artery to the proximal internal carotid artery  3.  10 flat Jackson-Pratt drain  Specimens: Culture from right neck and Dacron patch  Operative details: After obtaining form consent, the patient was taken to the operating.  The patient placed in supine position operating table.  After induction general anesthesia endotracheal ovation, the patient's*right neck and chest were prepped and draped in usual sterile fashion.  A pre-existing right neck incision was reopened using sharp and blunt dissection.  The tissues were severely inflamed.  Upon opening the deeper layers of the tissue I can see the dacryon patch at the bottom of the field.  There was some watery clear drainage within the neck.  This was sent for culture.  Proceeded to dissect out the common carotid artery.  There was dense inflammatory tissue which made identification of adjacent anatomical structures very difficult.  I was able to identify what I thought was the vagus nerve.  The hypoglossal nerve was never identified.  I tried to stay on a plane right adjacent to the artery to avoid any nerve injury.  Common carotid artery was dissected free circumferentially and umbilical tape placed around this.  Dissection then proceeded up the medial aspect of the artery and I was able to dissect  out the external carotid artery and placed a vessel loop around this.  Finally internal carotid artery was severely inflamed and very sticky scar tissue surrounding this.  After some tedious dissection I was able to get around this and also place a red vessel loop around it.  1 attention was turned to the patient's right groin.  SonoSite ultrasound was used to localize the right greater saphenous vein.  Incision was made over this carried down through the subcutaneous tissues down to the right greater saphenous vein.  It was of good quality about 3 mm in diameter.  It was dissected free circumferentially all the way up to the level saphenofemoral junction.  About 7 cm of vein was harvested.  Side branches were ligated divided tween silk ties.  The vein was transected proximally distally and tied off with a 2-0 silk tie.  The vein was then reversed and opened longitudinally.  The patient was then given 10,000 units of intravenous heparin.  After 2 minutes circulation time and raising the mean arterial pressure between 85-90, the distal internal carotid artery was controlled with a Kitzmiller clamp.  The external carotid artery was controlled with a vessel loop.  The common carotid artery was controlled with a peripheral DeBakey clamp.  The dacryon patch was incised and removed completely.  I did not place a shunt as I did not have really enough room to do this due to the dense inflammation of the area surrounding the common and internal carotid artery.  The wound was thoroughly irrigated with normal saline solution on multiple occasions.  Vein patch was placed in reverse configuration and sewn on as a patch angioplasty using a running 6-0  Prolene suture.  Just prior to completion of anastomosis it was forward bled backbled thoroughly flushed.  The anastomosis was completed.  Flow was then first restored retrograde from the external carotid artery and an antegrade from the common carotid artery and finally after about  5 cardiac cycles to the internal carotid artery.  These all had good Doppler flow.  Hemostasis was obtained with 100 mg of protamine as well as Avitene.  The wound was then thoroughly irrigated with 4 L of normal saline solution.  The deep layers were closed with a running 2-0 Vicryl suture.  The subcutaneous layers were closed with running 3-0 Vicryl suture.  The skin was closed with a 4-0 Vicryl subcuticular stitch.  The patient tolerated the procedure well and there were no complications.  The instrument sponge needle counts correct in the case.  Patient was taken to recovery in stable condition.  The patient was awakened in the operating room neurologically moving upper extremity and lower extremity prior to moving from the operating room.  Ruta Hinds, MD [facility] [place] Office: [phone]

## 2019-10-18 NOTE — Transfer of Care (Signed)
Immediate Anesthesia Transfer of Care Note  Patient: Kristin Byrd  Procedure(s) Performed: Incision and Drainage Right Neck, Removal Dacron Patch from Right Carotid Artery, Vein Patch Angioplasty Right Carotid Artery; Harvest Right Greater Saphenous Vein Graft (Right )  Patient Location: PACU  Anesthesia Type:General  Level of Consciousness: awake, alert  and oriented  Airway & Oxygen Therapy: Patient Spontanous Breathing and Patient connected to nasal cannula oxygen  Post-op Assessment: Report given to RN, Post -op Vital signs reviewed and stable, Patient moving all extremities, Patient moving all extremities X 4 and Patient able to stick tongue midline  Post vital signs: Reviewed and stable  Last Vitals:  Vitals Value Taken Time  BP 138/74 10/18/19 0143  Temp    Pulse 102 10/18/19 0146  Resp 13 10/18/19 0146  SpO2 95 % 10/18/19 0146  Vitals shown include unvalidated device data.  Last Pain:  Vitals:   10/17/19 1918  TempSrc:   PainSc: 0-No pain         Complications: No apparent anesthesia complications

## 2019-10-18 NOTE — Progress Notes (Signed)
  [facility]  Subjective  - left neck feels a little better  Objective 139/78 100 98.5 F (36.9 C) (Oral) 17 92%  Intake/Output Summary (Last 24 hours) at 10/18/2019 [id number] Last data filed at 10/18/2019 0859 Gross per 24 hour  Intake 2655.83 ml  Output 525 ml  Net 2130.83 ml   Left neck less erythema no drainage JP minimal Right thigh incision healing  Cultures neck and dacron so far negative  Assessment/Planning: S/p I D right neck and removal dacron patch with vein patch of artery  Vanc Zosyn for now will tailor based on culture Will need 1 month of home IV antibiotics  PICC prior to d/c Possibly home Monday or Tuesday if incision looks ok Keep JP today most likely d/c tomorrow  Ruta Hinds 10/18/2019 9:33 AM --  Laboratory Lab Results: Recent Labs    10/18/19 0451  WBC 10.8*  HGB 10.7*  HCT 31.3*  PLT 289   BMET Recent Labs    10/18/19 0451  NA 129*  K 3.7  CL 96*  CO2 22  GLUCOSE 229*  BUN 11  CREATININE 0.64  CALCIUM 8.4*    COAG Lab Results  Component Value Date   INR 1.0 09/26/2019   INR 1.00 07/28/2014   INR 0.98 08/14/2011   No results found for: PTT    \

## 2019-10-18 NOTE — Anesthesia Postprocedure Evaluation (Signed)
Anesthesia Post Note  Patient: Kristin Byrd  Procedure(s) Performed: Incision and Drainage Right Neck, Removal Dacron Patch from Right Carotid Artery, Vein Patch Angioplasty Right Carotid Artery; Harvest Right Greater Saphenous Vein Graft (Right )     Patient location during evaluation: PACU Anesthesia Type: General Level of consciousness: awake Pain management: pain level controlled Vital Signs Assessment: post-procedure vital signs reviewed and stable Respiratory status: spontaneous breathing Cardiovascular status: stable Postop Assessment: no apparent nausea or vomiting Anesthetic complications: no    Last Vitals:  Vitals:   10/18/19 0144 10/18/19 0200  BP: 138/74 (!) 145/69  Pulse: (!) 101 100  Resp: 14 19  Temp: 37.3 C   SpO2: 94% 92%    Last Pain:  Vitals:   10/18/19 0144  TempSrc:   PainSc: 0-No pain                 Yao Hyppolite

## 2019-10-18 NOTE — Progress Notes (Signed)
Spoke with Clarise Cruz RN re PICC order for home ABT.  States pt is not for d/c today and has adequate PIV access.

## 2019-10-18 NOTE — Progress Notes (Signed)
Pharmacy Antibiotic Note  Kristin Byrd is a 69 y.o. female admitted on 10/17/2019 requiring  surgical prophylaxis.  Pharmacy has been consulted for vancomycin dosing.  Plan: Vancomycin 1g IV Q12H x2 doses; f/u whether cont'd therapy indicated.  Weight: 218 lb 4.1 oz (99 kg)  Temp (24hrs), Avg:98.8 F (37.1 C), Min:98.2 F (36.8 C), Max:99.3 F (37.4 C)   Allergies  Allergen Reactions  . Hydrocodone Itching  . Oxycodone Itching  . Shellfish Allergy Nausea And Vomiting  . Sulfamethoxazole Nausea Only     Thank you for allowing pharmacy to be a part of this patient's care.  Wynona Neat, PharmD, BCPS  10/18/2019 3:02 AM

## 2019-10-18 NOTE — Progress Notes (Signed)
Spoke with Clarise Cruz RN re PICC to be placed 10/19/19 due to pt was in OR with sedatives late 10/17/19 into 10/18/19.  Will wait >24 hours for pt to sign consent.

## 2019-10-19 LAB — GLUCOSE, CAPILLARY
Glucose-Capillary: 109 mg/dL — ABNORMAL HIGH (ref 70–99)
Glucose-Capillary: 128 mg/dL — ABNORMAL HIGH (ref 70–99)
Glucose-Capillary: 118 mg/dL — ABNORMAL HIGH (ref 70–99)
Glucose-Capillary: 103 mg/dL — ABNORMAL HIGH (ref 70–99)

## 2019-10-19 LAB — BASIC METABOLIC PANEL
Anion gap: 15 (ref 5–15)
BUN: 15 mg/dL (ref 8–23)
CO2: 18 mmol/L — ABNORMAL LOW (ref 22–32)
Calcium: 8.8 mg/dL — ABNORMAL LOW (ref 8.9–10.3)
Chloride: 101 mmol/L (ref 98–111)
Creatinine, Ser: 0.73 mg/dL (ref 0.44–1.00)
GFR calc Af Amer: >60 mL/min (ref >60)
GFR calc non Af Amer: >60 mL/min (ref >60)
Glucose, Bld: 131 mg/dL — ABNORMAL HIGH (ref 70–99)
Potassium: 4.8 mmol/L (ref 3.5–5.1)
Sodium: 134 mmol/L — ABNORMAL LOW (ref 135–145)

## 2019-10-19 MED ORDER — SODIUM CHLORIDE 0.9% FLUSH
10.0000 mL | Freq: Two times a day (BID) | INTRAVENOUS | Status: DC
  Administered 2019-10-19 – 2019-10-23 (×8): 10 mL

## 2019-10-19 MED ORDER — SODIUM CHLORIDE 0.9% FLUSH: 10.0000 mL | INTRAVENOUS | Status: DC | PRN

## 2019-10-19 MED ORDER — WHITE PETROLATUM EX OINT
TOPICAL_OINTMENT | CUTANEOUS | Status: AC
  Filled 2019-10-19: qty 28.35

## 2019-10-19 MED ORDER — CHLORHEXIDINE GLUCONATE CLOTH 2 % EX PADS
6.0000 | MEDICATED_PAD | Freq: Every day | CUTANEOUS | Status: DC
  Administered 2019-10-19 – 2019-10-23 (×5): 6 via TOPICAL

## 2019-10-19 NOTE — Progress Notes (Signed)
Peripherally Inserted Central Catheter/Midline Placement  The IV Nurse has discussed with the patient and/or persons authorized to consent for the patient, the purpose of this procedure and the potential benefits and risks involved with this procedure.  The benefits include less needle sticks, lab draws from the catheter, and the patient may be discharged home with the catheter. Risks include, but not limited to, infection, bleeding, blood clot (thrombus formation), and puncture of an artery; nerve damage and irregular heartbeat and possibility to perform a PICC exchange if needed/ordered by physician.  Alternatives to this procedure were also discussed.  Bard Power PICC patient education guide, fact sheet on infection prevention and patient information card has been provided to patient /or left at bedside. PICC placed by Cherie Ouch RN   PICC/Midline Placement Documentation  PICC Single Lumen [id number] PICC Left Basilic 40 cm 0 cm (Active)  Indication for Insertion or Continuance of Line Home intravenous therapies (PICC only) 10/19/19 1042  Exposed Catheter (cm) 0 cm 10/19/19 1042  Site Assessment Clean;Dry;Intact 10/19/19 1042  Line Status Blood return noted;Flushed;Saline locked 10/19/19 1042  Dressing Type Transparent;Occlusive;Securing device 10/19/19 1042  Dressing Status Clean;Dry;Intact;Antimicrobial disc in place 10/19/19 1042  Line Adjustment (NICU/IV Team Only) No 10/19/19 1042  Dressing Intervention New dressing 10/19/19 1042  Dressing Change Due 10/26/19 10/19/19 1042       Edson Snowball 10/19/2019, 10:45 AM

## 2019-10-19 NOTE — Progress Notes (Signed)
[facility]  Subjective  - feels a little better   Objective 131/73 87 98 F (36.7 C) (Oral) 18 94%  Intake/Output Summary (Last 24 hours) at 10/19/2019 [id number] Last data filed at 10/19/2019 [id number] Gross per 24 hour  Intake 2155.72 ml  Output 4228 ml  Net -2072.28 ml   Neuro intact Neck incision clean still with some induration but no erythema or drainage JP minimal  Assessment/Planning: S/p removal infected carotid patch Continue broad spectrum antibiotics for now follow up cultures PICC today Home next few days if incision continues to look ok  Ruta Hinds 10/19/2019 9:29 AM --  Laboratory Lab Results: Recent Labs    10/18/19 0451  WBC 10.8*  HGB 10.7*  HCT 31.3*  PLT 289   BMET Recent Labs    10/18/19 0451  NA 129*  K 3.7  CL 96*  CO2 22  GLUCOSE 229*  BUN 11  CREATININE 0.64  CALCIUM 8.4*    COAG Lab Results  Component Value Date   INR 1.0 09/26/2019   INR 1.00 07/28/2014   INR 0.98 08/14/2011   No results found for: PTT

## 2019-10-19 NOTE — Progress Notes (Signed)
Spoke with Shawn Route, RN concerning PICC order. Patient able to sign consent. No discharge plans yet. Plan to place PICC today.

## 2019-10-20 LAB — BASIC METABOLIC PANEL
Anion gap: 12 (ref 5–15)
BUN: 14 mg/dL (ref 8–23)
CO2: 26 mmol/L (ref 22–32)
Calcium: 9 mg/dL (ref 8.9–10.3)
Chloride: 95 mmol/L — ABNORMAL LOW (ref 98–111)
Creatinine, Ser: 0.76 mg/dL (ref 0.44–1.00)
GFR calc Af Amer: >60 mL/min (ref >60)
GFR calc non Af Amer: >60 mL/min (ref >60)
Glucose, Bld: 161 mg/dL — ABNORMAL HIGH (ref 70–99)
Potassium: 3.5 mmol/L (ref 3.5–5.1)
Sodium: 133 mmol/L — ABNORMAL LOW (ref 135–145)

## 2019-10-20 LAB — AEROBIC CULTURE W GRAM STAIN (SUPERFICIAL SPECIMEN)
Culture: NO GROWTH
Gram Stain: NONE SEEN

## 2019-10-20 LAB — GLUCOSE, CAPILLARY
Glucose-Capillary: 121 mg/dL — ABNORMAL HIGH (ref 70–99)
Glucose-Capillary: 180 mg/dL — ABNORMAL HIGH (ref 70–99)
Glucose-Capillary: 134 mg/dL — ABNORMAL HIGH (ref 70–99)

## 2019-10-20 MED ORDER — SODIUM CHLORIDE 0.9 % IV SOLN
2.0000 g | INTRAVENOUS | Status: DC
  Administered 2019-10-20 – 2019-10-23 (×4): 2 g via INTRAVENOUS
  Filled 2019-10-20 (×4): qty 2

## 2019-10-20 NOTE — Progress Notes (Addendum)
[facility] [place] over all.  No new complaints other than drainage at incision.   Objective 130/71 80 98.8 F (37.1 C) (Oral) 12 95%  Intake/Output Summary (Last 24 hours) at 10/20/2019 0723 Last data filed at 10/20/2019 0616 Gross per 24 hour  Intake 500 ml  Output 6450 ml  Net -5950 ml    Right neck incision with SS drainage, no erythema, minimal edema with induration of skin. Drain site clean and dry Moving all 4 ext., palpable radial pulses, no tongue deviation and smile is symmetric. Lungs non labored breathing  Gen NAD  Assessment/Planning: POD # redo right CEA with right thigh vein harvest patch  PICC line placed cultures :  Special Requests HEMATOMA RIGHT   Gram Stain ABUNDANT WBC PRESENT, PREDOMINANTLY PMN  MODERATE GRAM POSITIVE COCCI   Culture FEW STREPTOCOCCUS INTERMEDIUS  SUSCEPTIBILITIES TO FOLLOW    Zosyn and Vancomyacin for 6 weeks IV at home, pending final cultures. Pending discharge after exam with Dr. Marguerita Merles Kristin Byrd 10/20/2019 7:23 AM --   Right neck drainage a little worrisome May need to open incision if this persists Keep in hospital  Transition over to Rocephin from [place] based on cultures Strep intermedius  Ruta Hinds, MD [facility] [place]: [phone]  Laboratory Lab Results: Recent Labs    10/18/19 0451  WBC 10.8*  HGB 10.7*  HCT 31.3*  PLT 289   BMET Recent Labs    10/19/19 1209 10/20/19 0428  NA 134* 133*  K 4.8 3.5  CL 101 95*  CO2 18* 26  GLUCOSE 131* 161*  BUN 15 14  CREATININE 0.73 0.76  CALCIUM 8.8* 9.0    COAG Lab Results  Component Value Date   INR 1.0 09/26/2019   INR 1.00 07/28/2014   INR 0.98 08/14/2011   No results found for: PTT

## 2019-10-20 NOTE — Progress Notes (Addendum)
PHARMACY CONSULT NOTE FOR:  OUTPATIENT  PARENTERAL ANTIBIOTIC THERAPY (OPAT)  Indication: post op right CEA infection Regimen: Rocephin 2gm IV q24h End date: 11/21/19  IV antibiotic discharge orders are pended. To discharging provider:  please sign these orders via discharge navigator,  Select New Orders & click on the button choice - Manage This Unsigned Work.     Thank you for allowing pharmacy to be a part of this patient's care.  Hildred Laser, PharmD Clinical Pharmacist **Pharmacist phone directory can now be found on [email or web address] ([email or web address]).  Listed under [place].

## 2019-10-21 LAB — GLUCOSE, CAPILLARY
Glucose-Capillary: 116 mg/dL — ABNORMAL HIGH (ref 70–99)
Glucose-Capillary: 123 mg/dL — ABNORMAL HIGH (ref 70–99)
Glucose-Capillary: 128 mg/dL — ABNORMAL HIGH (ref 70–99)
Glucose-Capillary: 111 mg/dL — ABNORMAL HIGH (ref 70–99)
Glucose-Capillary: 104 mg/dL — ABNORMAL HIGH (ref 70–99)

## 2019-10-21 NOTE — Progress Notes (Addendum)
[facility] [place]  Subjective  - No new complaints, feeling like edema is decreasing at the incision area right neck.   Objective 132/73 89 98.7 F (37.1 C) (Oral) 15 98%  Intake/Output Summary (Last 24 hours) at 10/21/2019 0754 Last data filed at 10/20/2019 1822 Gross per 24 hour  Intake 655 ml  Output --  Net 655 ml    Moving all 4 ext. Right neck dressing with minimal drainage.  Clean dry dressing applied. No active drainage with dressing change this am.   No erythema surrounding the incision No tongue deviation and smile is symmetric  Assessment/Planning: POD # 4 redo right CEA with right thigh vein harvest patch  Now on Rocephin for coverage gram positive cocci and strep Minimal drainage on old dressing and no active drainage this am.  No erythema surrounding incision.  Will discuss with Dr. Oneida Alar.       Kristin Byrd 10/21/2019 7:54 AM --  Still with some drainage right neck as well as induration no erythema Right leg incision clean healing Continue to watch incision in hospital for now Rocephin for total 1 month  Ruta Hinds, MD [facility] [place]: [phone]   Laboratory Lab Results: No results for input(s): WBC, HGB, HCT, PLT in the last 72 hours. BMET Recent Labs    10/19/19 1209 10/20/19 0428  NA 134* 133*  K 4.8 3.5  CL 101 95*  CO2 18* 26  GLUCOSE 131* 161*  BUN 15 14  CREATININE 0.73 0.76  CALCIUM 8.8* 9.0    COAG Lab Results  Component Value Date   INR 1.0 09/26/2019   INR 1.00 07/28/2014   INR 0.98 08/14/2011   No results found for: PTT

## 2019-10-21 NOTE — TOC Initial Note (Signed)
Transition of Care (TOC) - Initial/Assessment Note  Marvetta Gibbons RN, BSN Transitions of Care Unit 4E- RN Case Manager [phone]   Patient Details  Name: Kristin Byrd MRN: [id number] Date of Birth: 04/21/1951  Transition of Care [facility]) CM/SW Contact:    Dawayne Patricia, RN Phone Number: 10/21/2019, 2:11 PM  Clinical Narrative:                 Pt from home admitted s/p redo right CEA, will need 6 wks home IV abx. Orders have been placed for OPAT and HHRN. CM spoke with pt and spouse at bedside [facility] list had been provided on 2/8 Per CMS guidelines from medicare.gov website with star ratings (copy placed in shadow chart) for pt to review. Per pt and spouse- they would like to use [facility] for [facility] needs and [facility] for IV abx needs- address, phone # and PCP all confirmed in epic. Call made to [facility] with [facility] for [facility] referral- referral pending- referral declined- will f/u with pt for alternate choice.  Call made to [facility] with [facility]- referral has been accepted for IV abx needs they will coordinate with [facility] agency that accepts for infusion needs, Pam will f/u with pt and spouse for bedside education needs prior to discharge.   Expected Discharge Plan: [place] Barriers to Discharge: Continued Medical Work up   Patient Goals and CMS Choice Patient states their goals for this hospitalization and ongoing recovery are:: return home CMS Medicare.gov Compare Post Acute Care list provided to:: Patient Choice offered to / list presented to : Patient  Expected Discharge Plan and Services Expected Discharge Plan: [place]   Discharge Planning Services: CM Consult Post Acute Care Choice: [place] arrangements for the past 2 months: Single Family Home                           HH Arranged: RN, IV Antibiotics HH Agency: [facility] Date HH Agency Contacted: 10/21/19 Time HH Agency Contacted:  38 Representative spoke with at [place]: [place] Arrangements/Services Living arrangements for the past 2 months: [place] with:: Spouse Patient language and need for interpreter reviewed:: Yes Do you feel safe going back to the place where you live?: Yes      Need for Family Participation in Patient Care: Yes (Comment) Care giver support system in place?: Yes (comment)   Criminal Activity/Legal Involvement Pertinent to Current Situation/Hospitalization: No - Comment as needed  Activities of Daily Living Home Assistive Devices/Equipment: Blood pressure cuff, CBG Meter ADL Screening (condition at time of admission) Patient's cognitive ability adequate to safely complete daily activities?: Yes Is the patient deaf or have difficulty hearing?: No Does the patient have difficulty seeing, even when wearing glasses/contacts?: No Does the patient have difficulty concentrating, remembering, or making decisions?: No Patient able to express need for assistance with ADLs?: Yes Does the patient have difficulty dressing or bathing?: No Independently performs ADLs?: Yes (appropriate for developmental age) Does the patient have difficulty walking or climbing stairs?: No Weakness of Legs: None Weakness of Arms/Hands: None  Permission Sought/Granted Permission sought to share information with : Investment banker, corporate granted to share info w AGENCY: HH        Emotional Assessment Appearance:: Appears stated age Attitude/Demeanor/Rapport: Engaged Affect (typically observed): Appropriate Orientation: : Oriented to Self,  Oriented to Place, Oriented to  Time, Oriented to Situation   Psych Involvement: No (comment)  Admission diagnosis:  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 Incisional infection 10/18/2019  . 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  Carotid stenosis, asymptomatic, right 09/30/2019  . Obese 08/05/2014  . S/P left THA, AA 08/04/2014  . S/P right total hip replacement 08/16/2011   PCP:  Leilani Able, FNP Pharmacy:   [facility], [place] [address] Phone: [phone] Fax: [phone]     Social Determinants of Health (SDOH) Interventions    Readmission Risk Interventions No flowsheet data found.

## 2019-10-22 LAB — GLUCOSE, CAPILLARY
Glucose-Capillary: 120 mg/dL — ABNORMAL HIGH (ref 70–99)
Glucose-Capillary: 95 mg/dL (ref 70–99)
Glucose-Capillary: 100 mg/dL — ABNORMAL HIGH (ref 70–99)
Glucose-Capillary: 126 mg/dL — ABNORMAL HIGH (ref 70–99)

## 2019-10-22 NOTE — Progress Notes (Addendum)
[facility] [place]  Subjective  - No new complaints.   Objective (!) 141/77 82 98.6 F (37 C) (Oral) 20 97%  Intake/Output Summary (Last 24 hours) at 10/22/2019 0738 Last data filed at 10/21/2019 2141 Gross per 24 hour  Intake 150 ml  Output --  Net 150 ml    Moving all 4 ext.  No tongue deviation and smile is symmetric Right neck incision without active drainage, minimal drainage on dressing without evidence of purulence.  Right neck incision without erythema, edema dissipating slowly.  Central incision firmness.   No complaints of right thigh incision, healing well Lungs non labored breathing  Assessment/Planning: POD # 5 redo right CEA with right thigh vein harvest for infected right CEA  NO ANAEROBES ISOLATED; CULTURE IN PROGRESS FOR 5 DAYS  DACRON PATCH NO GROWTH 2 DAYS STREPTOCOCCUS INTERMEDIUS, FEW STREPTOCOCCUS INTERMEDIUS   Currently on Rocephin and we will cont it for 1 month total start date 2/8/2.     Roxy Horseman 10/22/2019 7:38 AM --  Agree with above.  Still some serous drainage from neck.  No fluctuance or erythema Most likely d/c home tomorrow if wound does not deteriorate Rocephin IV PICC for one month  Ruta Hinds, MD [facility] [place]: [phone]  Laboratory Lab Results: No results for input(s): WBC, HGB, HCT, PLT in the last 72 hours. BMET Recent Labs    10/19/19 1209 10/20/19 0428  NA 134* 133*  K 4.8 3.5  CL 101 95*  CO2 18* 26  GLUCOSE 131* 161*  BUN 15 14  CREATININE 0.73 0.76  CALCIUM 8.8* 9.0    COAG Lab Results  Component Value Date   INR 1.0 09/26/2019   INR 1.00 07/28/2014   INR 0.98 08/14/2011   No results found for: PTT

## 2019-10-22 NOTE — Care Management Important Message (Signed)
Important Message  Patient Details  Name: Kristin Byrd MRN: [id number] Date of Birth: [birth year]   Medicare Important Message Given:  Yes     Shelda Altes 10/22/2019, 1:53 PM

## 2019-10-23 LAB — AEROBIC/ANAEROBIC CULTURE W GRAM STAIN (SURGICAL/DEEP WOUND)

## 2019-10-23 LAB — ANAEROBIC CULTURE

## 2019-10-23 LAB — GLUCOSE, CAPILLARY
Glucose-Capillary: 107 mg/dL — ABNORMAL HIGH (ref 70–99)
Glucose-Capillary: 95 mg/dL (ref 70–99)

## 2019-10-23 MED ORDER — CEFTRIAXONE IV (FOR PTA / DISCHARGE USE ONLY): 2.0000 g | INTRAVENOUS | 0 refills | Status: AC

## 2019-10-23 MED ORDER — HYDROCODONE-ACETAMINOPHEN 5-325 MG PO TABS: 1.0000 | ORAL_TABLET | ORAL | 0 refills | Status: DC | PRN

## 2019-10-23 NOTE — TOC Progression Note (Signed)
Transition of Care (TOC) - Progression Note  Marvetta Gibbons RN, BSN Transitions of Care Unit 4E- RN Case Manager [phone]   Patient Details  Name: Kristin Byrd MRN: [id number] Date of Birth: Jan 12, 1951  Transition of Care [facility]) CM/SW Contact  [name], Romeo Rabon, RN Phone Number: 10/23/2019, 11:34 AM  Clinical Narrative:    F/U done with pt regarding [place] referral- informed pt that [facility] was not able to accept referral- per pt she has selected [facility] for alternate as per speaking with Vascular MD. Call made to [place] with [facility] regarding referral for [facility] and home IV abx needs- Jonelle Sidle will review and let Cm know if they an accept.    Expected Discharge Plan: [place] Barriers to Discharge: Barriers Resolved  Expected Discharge Plan and Services Expected Discharge Plan: [place]   Discharge Planning Services: CM Consult Post Acute Care Choice: [place] arrangements for the past 2 months: Single Family Home Expected Discharge Date: 10/23/19               DME Arranged: N/A DME Agency: NA       HH Arranged: RN, IV Antibiotics HH Agency: [facility] Date [place]: 10/22/19 Time HH Agency Contacted: 1330 Representative spoke with at [place]: [place] ([place]) Interventions    Readmission Risk Interventions Readmission Risk Prevention Plan 10/23/2019  Transportation Screening Complete  PCP or Specialist Appt within 5-7 Days Complete  Home Care Screening Complete  Medication Review (RN CM) Complete  Some recent data might be hidden

## 2019-10-23 NOTE — TOC Transition Note (Signed)
Transition of Care [facility]) - CM/SW Discharge Note Marvetta Gibbons RN, BSN Transitions of Care Unit 4E- RN Case Manager [phone]   Patient Details  Name: Kristin Byrd MRN: [id number] Date of Birth: Dec 01, 1950  Transition of Care [facility]) CM/SW Contact:  Dawayne Patricia, RN Phone Number: 10/23/2019, 11:31 AM   Clinical Narrative:    Pt stable and has been cleared for transition home, spouse to transport home. Pam with [facility] pharmacy to come to bedside for education on home IV abx needs prior to discharge. Pt will receive today's dose at noon prior to going home. Per conversation with pt yesterday on alternate choice for [facility] agency- referral was made to [facility]- have been notified by Tiffany with [facility] that pt has been accepted for [facility] needs and they will coordinate with [facility] for home IV abx needs. SOC will be tomorrow 2/12. Pt informed of acceptance by [facility] for [facility] needs.    Final next level of care: [place] Barriers to Discharge: Barriers Resolved   Patient Goals and CMS Choice Patient states their goals for this hospitalization and ongoing recovery are:: return home CMS Medicare.gov Compare Post Acute Care list provided to:: Patient Choice offered to / list presented to : Patient  Discharge Placement                  Home with [facility]      Discharge Plan and Services   Discharge Planning Services: CM Consult Post Acute Care Choice: Home Health          DME Arranged: N/A DME Agency: NA       HH Arranged: RN, IV Antibiotics HH Agency: [facility] Date [place]: 10/22/19 Time HH Agency Contacted: 1330 Representative spoke with at [place]: [place] ([place]) Interventions     Readmission Risk Interventions Readmission Risk Prevention Plan 10/23/2019  Transportation Screening Complete  PCP or Specialist Appt within 5-7 Days Complete  Home  Care Screening Complete  Medication Review (RN CM) Complete  Some recent data might be hidden

## 2019-10-23 NOTE — Progress Notes (Signed)
Discharge instructions provided to patient. All medication, follow up appointments, and discharge instructions discussed. IV out. Monitor off CCMD notified.  Pt discharging to home with home health and PICC line for home antibiotics. All questions answered.  Paulene Floor, RN

## 2019-10-23 NOTE — Discharge Instructions (Signed)
   [facility] [place]  Discharge Instructions   Carotid Endarterectomy (CEA)  Please refer to the following instructions for your post-procedure care. Your surgeon or physician assistant will discuss any changes with you.  Activity  You are encouraged to walk as much as you can. You can slowly return to normal activities but must avoid strenuous activity and heavy lifting until your doctor tell you it's OK. Avoid activities such as vacuuming or swinging a golf club. You can drive after one week if you are comfortable and you are no longer taking prescription pain medications. It is normal to feel tired for serval weeks after your surgery. It is also normal to have difficulty with sleep habits, eating, and bowel movements after surgery. These will go away with time.  Bathing/Showering  You may shower after you come home. Do not soak in a bathtub, hot tub, or swim until the incision heals completely.  Incision Care  Shower every day. Clean your incision with mild soap and water. Pat the area dry with a clean towel. You do not need a bandage unless otherwise instructed. Do not apply any ointments or creams to your incision. You may have skin glue on your incision. Do not peel it off. It will come off on its own in about one week. Your incision may feel thickened and raised for several weeks after your surgery. This is normal and the skin will soften over time. For Men Only: It's OK to shave around the incision but do not shave the incision itself for 2 weeks. It is common to have numbness under your chin that could last for several months.  Diet  Resume your normal diet. There are no special food restrictions following this procedure. A low fat/low cholesterol diet is recommended for all patients with vascular disease. In order to heal from your surgery, it is CRITICAL to get adequate nutrition. Your body requires vitamins, minerals, and protein. Vegetables are the best  source of vitamins and minerals. Vegetables also provide the perfect balance of protein. Processed food has little nutritional value, so try to avoid this.        Medications  Resume taking all of your medications unless your doctor or physician assistant tells you not to. If your incision is causing pain, you may take over-the- counter pain relievers such as acetaminophen (Tylenol). If you were prescribed a stronger pain medication, please be aware these medications can cause nausea and constipation. Prevent nausea by taking the medication with a snack or meal. Avoid constipation by drinking plenty of fluids and eating foods with a high amount of fiber, such as fruits, vegetables, and grains. Do not take Tylenol if you are taking prescription pain medications.  Follow Up  Our office will schedule a follow up appointment 2-3 weeks following discharge.  Please call us immediately for any of the following conditions  Increased pain, redness, drainage (pus) from your incision site. Fever of 101 degrees or higher. If you should develop stroke (slurred speech, difficulty swallowing, weakness on one side of your body, loss of vision) you should call 911 and go to the nearest emergency room.  Reduce your risk of vascular disease:  Stop smoking. If you would like help call [facility] at 1-800-QUIT-NOW (1-800-784-8669) or [place] at 336-586-4000. Manage your cholesterol Maintain a desired weight Control your diabetes Keep your blood pressure down  If you have any questions, please call the office at [phone].   

## 2019-10-23 NOTE — Progress Notes (Addendum)
[facility] [place]  Subjective  - Doing well and ready to go home.   Objective 139/74 93 98.5 F (36.9 C) (Oral) 11 98%  Intake/Output Summary (Last 24 hours) at 10/23/2019 0700 Last data filed at 10/22/2019 2300 Gross per 24 hour  Intake 620 ml  Output --  Net 620 ml    No tongue deviation. Smile is symmetric. Right neck incision decreased edema, minimal SS drainage on old dressing Moving all 4 ext.  Left UP PICC line in place Right thigh incision healing well Lungs non labored breathing  Assessment/Planning: POD # 6 redo right CEA with right thigh vein harvest for infected right CEA  NO ANAEROBES ISOLATED; CULTURE IN PROGRESS FOR 5 DAYS  DACRON PATCH NO GROWTH 2 DAYS STREPTOCOCCUS INTERMEDIUS, FEW STREPTOCOCCUS INTERMEDIUS   Currently on Rocephin and we will cont it for 1 month total start date 2/8/2.    Discharge with [facility] RN for IV antibiotics and wound check.    Roxy Horseman 10/23/2019 7:00 AM --  Laboratory Lab Results: No results for input(s): WBC, HGB, HCT, PLT in the last 72 hours. BMET No results for input(s): NA, K, CL, CO2, GLUCOSE, BUN, CREATININE, CALCIUM in the last 72 hours.  COAG Lab Results  Component Value Date   INR 1.0 09/26/2019   INR 1.00 07/28/2014   INR 0.98 08/14/2011   No results found for: PTT  I have interviewed the patient and examined the patient. I agree with the findings by the PA.  Agree with plans for discharge today on Rocephin via PICC line.  The culture of the hematoma grew Streptococcus intermedius.  This is sensitive to ceftriaxone which she is on.  The Dacron patch culture has been negative so far  Gae Gallop, MD [phone]

## 2019-10-24 DIAGNOSIS — E119 Type 2 diabetes mellitus without complications: Secondary | ICD-10-CM | POA: Diagnosis not present

## 2019-10-24 DIAGNOSIS — A408 Other streptococcal sepsis: Secondary | ICD-10-CM | POA: Diagnosis not present

## 2019-10-24 DIAGNOSIS — T8149XA Infection following a procedure, other surgical site, initial encounter: Secondary | ICD-10-CM | POA: Diagnosis not present

## 2019-10-24 DIAGNOSIS — Z452 Encounter for adjustment and management of vascular access device: Secondary | ICD-10-CM | POA: Diagnosis not present

## 2019-10-24 DIAGNOSIS — I10 Essential (primary) hypertension: Secondary | ICD-10-CM | POA: Diagnosis not present

## 2019-10-24 DIAGNOSIS — Z5181 Encounter for therapeutic drug level monitoring: Secondary | ICD-10-CM | POA: Diagnosis not present

## 2019-10-24 DIAGNOSIS — Z48812 Encounter for surgical aftercare following surgery on the circulatory system: Secondary | ICD-10-CM | POA: Diagnosis not present

## 2019-10-24 DIAGNOSIS — T8579XA Infection and inflammatory reaction due to other internal prosthetic devices, implants and grafts, initial encounter: Secondary | ICD-10-CM | POA: Diagnosis not present

## 2019-10-27 DIAGNOSIS — T8549XA Other mechanical complication of breast prosthesis and implant, initial encounter: Secondary | ICD-10-CM | POA: Diagnosis not present

## 2019-10-27 NOTE — Discharge Summary (Signed)
[facility] Discharge Summary   Patient ID:  Kristin Byrd MRN: [id number] DOB/AGE: 02-15-1951 69 y.o.  Admit date: 10/17/2019 Discharge date: 10/23/19 Date of Surgery: 10/17/2019 - 10/18/2019 Surgeon: Juliann Mule): Fields, Jessy Oto, MD  Admission Diagnosis: Infection of graft ([place]) [T85.79XA] Incisional infection [T81.49XA]  Discharge Diagnoses:  Infection of graft ([place]) [T85.79XA] Incisional infection [T81.49XA]  Secondary Diagnoses: Past Medical History:  Diagnosis Date  . Arthritis   . Diabetes mellitus   . Hypertension   . PONV (postoperative nausea and vomiting)     Procedure(s): Incision and Drainage Right Neck, Removal Dacron Patch from Right Carotid Artery, Vein Patch Angioplasty Right Carotid Artery; Harvest Right Greater Saphenous Vein Graft  Discharged Condition: stable  HPI: 69 y/o female  s/p right CEA on 09/30/19 by Dr. Oneida Alar.   She was seen in the office by Dr. Oneida Alar on 10/16/19 and had some fullness and swelling in her right neck thought to be hematoma.  Over the last 24 hours her neck has become more red swollen and tender and apparently drained some purulent material today. She was told to come to [facility] and scheduled for emergency irrigation and debridement with possible repair/removal of the Dacron patch.     Hospital Course:  Kristin Byrd is a 69 y.o. female is S/P Procedure(s): Incision and Drainage Right Neck, Removal Dacron Patch from Right Carotid Artery, Vein Patch Angioplasty Right Carotid Artery; Harvest Right Greater Saphenous Vein Graft Post op she was placed on IV antibiotics broad spectrum  and cultures where sent for analysis.  PICC line place and plans for [facility] IV antibiotics where made.   Her antibiotics were changed to Rocephin for coverage gram positive cocci and strep.  Minimal drainage from right neck incision, no erythema.  Discharged home in stable condition.  No neurologic deficits.     Significant Diagnostic  Studies: CBC Lab Results  Component Value Date   WBC 10.8 (H) 10/18/2019   HGB 10.7 (L) 10/18/2019   HCT 31.3 (L) 10/18/2019   MCV 92.3 10/18/2019   PLT 289 10/18/2019    BMET    Component Value Date/Time   NA 133 (L) 10/20/2019 0428   K 3.5 10/20/2019 0428   CL 95 (L) 10/20/2019 0428   CO2 26 10/20/2019 0428   GLUCOSE 161 (H) 10/20/2019 0428   BUN 14 10/20/2019 0428   CREATININE 0.76 10/20/2019 0428   CALCIUM 9.0 10/20/2019 0428   GFRNONAA >60 10/20/2019 0428   GFRAA >60 10/20/2019 0428   COAG Lab Results  Component Value Date   INR 1.0 09/26/2019   INR 1.00 07/28/2014   INR 0.98 08/14/2011     Disposition:  Discharge to :Home Discharge Instructions    Call MD for:  redness, tenderness, or signs of infection (pain, swelling, bleeding, redness, odor or green/yellow discharge around incision site)   Complete by: As directed    Call MD for:  severe or increased pain, loss or decreased feeling  in affected limb(s)   Complete by: As directed    Call MD for:  temperature >100.5   Complete by: As directed    Discharge instructions   Complete by: As directed    May wash your right neck area gently and leg incision.  Keep PICC line clean and dry.   Home infusion instructions   Complete by: As directed    Instructions: Flushing of vascular access device: 0.9% NaCl pre/post medication administration and prn patency; Heparin 100 u/ml, 89m for implanted ports  and Heparin 10u/ml, 41m for all other central venous catheters.   Resume previous diet   Complete by: As directed      Allergies as of 10/23/2019      Reactions   Hydrocodone Itching   Oxycodone Itching   Shellfish Allergy Nausea And Vomiting   Sulfamethoxazole Nausea Only      Medication List    TAKE these medications   amLODipine 10 MG tablet Commonly known as: NORVASC Take 10 mg by mouth daily.   aspirin 81 MG EC tablet Take 81 mg by mouth at bedtime.   atorvastatin 10 MG tablet Commonly known as:  LIPITOR Take 10 mg by mouth daily.   cefTRIAXone  IVPB Commonly known as: ROCEPHIN Inject 2 g into the vein daily for 28 days. Indication: Post op CEA infection Last Day of Therapy:  11/21/2019 Labs - Once weekly:  CBC/D and BMP, Labs - Every other week:  ESR and CRP   cetirizine 10 MG tablet Commonly known as: ZYRTEC Take 10 mg by mouth at bedtime.   diphenhydramine-acetaminophen 25-500 MG Tabs tablet Commonly known as: TYLENOL PM Take 1 tablet by mouth at bedtime.   fluticasone 50 MCG/ACT nasal spray Commonly known as: FLONASE Place 1-2 sprays into both nostrils daily as needed for allergies or rhinitis.   HYDROcodone-acetaminophen 5-325 MG tablet Commonly known as: NORCO/VICODIN Take 1-2 tablets by mouth every 4 (four) hours as needed for moderate pain. What changed: how much to take   Janumet 50-500 MG tablet Generic drug: sitaGLIPtin-metformin Take 2 tablets by mouth daily. SAMPLE   losartan-hydrochlorothiazide 50-12.5 MG tablet Commonly known as: HYZAAR Take 1 tablet by mouth 2 (two) times daily. What changed: Another medication with the same name was removed. Continue taking this medication, and follow the directions you see here.   metFORMIN 1000 MG tablet Commonly known as: GLUCOPHAGE Take 1,000 mg by mouth at bedtime.   montelukast 10 MG tablet Commonly known as: SINGULAIR Take 10 mg by mouth daily.   REFRESH 1 % ophthalmic solution Generic drug: carboxymethylcellulose Apply 1-2 drops to eye 3 (three) times daily as needed (DRY/IRRITATED EYES.).   Vitamin D 50 MCG (2000 UT) tablet Take 2,000 Units by mouth daily.   Vitamin D3 50 MCG (2000 UT) Tabs Take 2,000 Units by mouth.            Home Infusion Instuctions  (From admission, onward)         Start     Ordered   10/23/19 0000  Home infusion instructions    Question:  Instructions  Answer:  Flushing of vascular access device: 0.9% NaCl pre/post medication administration and prn patency;  Heparin 100 u/ml, 552mfor implanted ports and Heparin 10u/ml, 73m24mor all other central venous catheters.   10/23/19 0743         Verbal and written Discharge instructions given to the patient. Wound care per Discharge AVS Follow-up Information    [facility] DutchD Follow up in 2 week(s).   Specialties: Vascular Surgery, Cardiology Why: office will call Contact information: [address] [phone]     [facility] Follow up.   Specialty: [place]: HHR[place]ckneyranged for PICC line care and lab draws- home IV abx to coordinate with [facility] Contact information: [address] [phone]        Signed: EmmRoxy Horseman15/2021, 11:53 AM --- For  VQI Registry use --- Instructions: Press F2 to tab through selections.  Delete question if not applicable.   Modified Rankin score at D/C (0-6): Rankin Score=0  IV medication needed for:  1. Hypertension: No 2. Hypotension: No  Post-op Complications: No  1. Post-op CVA or TIA: No  If yes: Event classification (right eye, left eye, right cortical, left cortical, verterobasilar, other):   If yes: Timing of event (intra-op, <6 hrs post-op, >=6 hrs post-op, unknown):   2. CN injury: No  If yes: CN  injuried   3. Myocardial infarction: No  If yes: Dx by (EKG or clinical, Troponin):   4.  CHF: No  5.  Dysrhythmia (new): No  6. Wound infection: Yes  7. Reperfusion symptoms: No  8. Return to OR: No  If yes: return to OR for (bleeding, neurologic, other CEA incision, other):   Discharge medications: Statin use:  Yes ASA use:  Yes Beta blocker use:  No  for medical reason   ACE-Inhibitor use:  No  for medical reason   P2Y12 Antagonist use: [x ] None, [ ]  Plavix, [ ]  Plasugrel, [ ]  Ticlopinine, [ ]  Ticagrelor, [ ]  Other, [ ]  No for medical reason, [ ]  Non-compliant, [ ]  Not-indicated Anti-coagulant use:   [x ] None, [ ]  Warfarin, [ ]  Rivaroxaban, [ ]  Dabigatran, [ ]  Other, [ ]  No for medical reason, [ ]  Non-compliant, [ ]  Not-indicated

## 2019-10-31 DIAGNOSIS — T8149XA Infection following a procedure, other surgical site, initial encounter: Secondary | ICD-10-CM | POA: Diagnosis not present

## 2019-10-31 DIAGNOSIS — A408 Other streptococcal sepsis: Secondary | ICD-10-CM | POA: Diagnosis not present

## 2019-10-31 DIAGNOSIS — T8579XA Infection and inflammatory reaction due to other internal prosthetic devices, implants and grafts, initial encounter: Secondary | ICD-10-CM | POA: Diagnosis not present

## 2019-11-03 DIAGNOSIS — T8149XA Infection following a procedure, other surgical site, initial encounter: Secondary | ICD-10-CM | POA: Diagnosis not present

## 2019-11-05 ENCOUNTER — Encounter: Payer: Self-pay | Admitting: Vascular Surgery

## 2019-11-05 ENCOUNTER — Other Ambulatory Visit: Payer: Self-pay

## 2019-11-05 ENCOUNTER — Ambulatory Visit (INDEPENDENT_AMBULATORY_CARE_PROVIDER_SITE_OTHER): Payer: Self-pay | Admitting: Vascular Surgery

## 2019-11-05 VITALS — BP 120/75 | HR 100 | Temp 97.1°F | Resp 18 | Ht 69.0 in | Wt 205.9 lb

## 2019-11-05 DIAGNOSIS — I6521 Occlusion and stenosis of right carotid artery: Secondary | ICD-10-CM

## 2019-11-05 NOTE — Progress Notes (Signed)
Patient is a 69 year old female who returns for postoperative follow-up today.  She underwent carotid endarterectomy September 30, 2019.  She had no postoperative neurologic events.  However, she developed an infection of the patch and surgical site had the patch removed and a vein patch placed October 17, 2019.  She was discharged home on IV antibiotics for a 1 month course.  She is currently on Rocephin.  This is being delivered via a PICC line.  She has had no problems with the PICC line.  She has had no symptoms of TIA amaurosis or stroke.  She has had no further incisional drainage.  Her right groin incision is healing well.  She is asking whether or not she can start lifting at this point.  She apparently recently had a brand-new grandchild born.  The child weighs 9 pounds.  She wishes to be able to lift greater than 9 pounds.  Physical exam:  Vitals:   11/05/19 0949  BP: 120/75  Pulse: 100  Resp: 18  Temp: (!) 97.1 F (36.2 C)  TempSrc: Temporal  SpO2: 98%  Weight: 205 lb 14.4 oz (93.4 kg)  Height: 5\' 9"  (1.753 m)    Left upper extremity PICC line site nontender no erythema  Neck: Healing right neck incision minimal erythema no fluctuance no drainage  Right lower extremity incision right groin healing well no erythema no drainage  Neuro: Symmetric upper extremity lower extremity motor strength 5/5 no facial asymmetry  Assessment: Doing well status post right carotid endarterectomy and subsequent infection.  She seems to be healing well from the recent patch angioplasty with a vein patch.  All of her incisions are healing.  She will have 2 more weeks of IV antibiotics.  Plan: The patient will follow up in 3 weeks time to recheck her incision.  At that time did have completed her antibiotic course.  She will call me if she has any further problems prior to this.  I believe it is okay for her to start lifting weights more than 9 pounds.  Ruta Hinds, MD [facility] [place] Office: [phone]

## 2019-11-06 ENCOUNTER — Ambulatory Visit: Payer: PPO | Admitting: Vascular Surgery

## 2019-11-07 DIAGNOSIS — T8579XA Infection and inflammatory reaction due to other internal prosthetic devices, implants and grafts, initial encounter: Secondary | ICD-10-CM | POA: Diagnosis not present

## 2019-11-07 DIAGNOSIS — T8149XA Infection following a procedure, other surgical site, initial encounter: Secondary | ICD-10-CM | POA: Diagnosis not present

## 2019-11-07 DIAGNOSIS — A408 Other streptococcal sepsis: Secondary | ICD-10-CM | POA: Diagnosis not present

## 2019-11-10 DIAGNOSIS — I10 Essential (primary) hypertension: Secondary | ICD-10-CM | POA: Diagnosis not present

## 2019-11-10 DIAGNOSIS — Z48812 Encounter for surgical aftercare following surgery on the circulatory system: Secondary | ICD-10-CM | POA: Diagnosis not present

## 2019-11-10 DIAGNOSIS — T8149XA Infection following a procedure, other surgical site, initial encounter: Secondary | ICD-10-CM | POA: Diagnosis not present

## 2019-11-10 DIAGNOSIS — Z5181 Encounter for therapeutic drug level monitoring: Secondary | ICD-10-CM | POA: Diagnosis not present

## 2019-11-10 DIAGNOSIS — Z79899 Other long term (current) drug therapy: Secondary | ICD-10-CM | POA: Diagnosis not present

## 2019-11-10 DIAGNOSIS — T8579XA Infection and inflammatory reaction due to other internal prosthetic devices, implants and grafts, initial encounter: Secondary | ICD-10-CM | POA: Diagnosis not present

## 2019-11-10 DIAGNOSIS — E119 Type 2 diabetes mellitus without complications: Secondary | ICD-10-CM | POA: Diagnosis not present

## 2019-11-10 DIAGNOSIS — Z452 Encounter for adjustment and management of vascular access device: Secondary | ICD-10-CM | POA: Diagnosis not present

## 2019-11-14 DIAGNOSIS — T8149XA Infection following a procedure, other surgical site, initial encounter: Secondary | ICD-10-CM | POA: Diagnosis not present

## 2019-11-14 DIAGNOSIS — T8579XA Infection and inflammatory reaction due to other internal prosthetic devices, implants and grafts, initial encounter: Secondary | ICD-10-CM | POA: Diagnosis not present

## 2019-11-14 DIAGNOSIS — A408 Other streptococcal sepsis: Secondary | ICD-10-CM | POA: Diagnosis not present

## 2019-11-17 ENCOUNTER — Telehealth: Payer: Self-pay

## 2019-11-17 DIAGNOSIS — A499 Bacterial infection, unspecified: Secondary | ICD-10-CM | POA: Diagnosis not present

## 2019-11-17 DIAGNOSIS — T8149XA Infection following a procedure, other surgical site, initial encounter: Secondary | ICD-10-CM | POA: Diagnosis not present

## 2019-11-17 NOTE — Telephone Encounter (Addendum)
I called Holly and left a voice message with this information on her secured voice line.  Thurston Hole., LPN  ----- Message from Elam Dutch, MD sent at 11/17/2019 12:18 PM EST -----  Regarding: RE: PICC Line Contact: [phone]   Yes   ----- Message ----- From: Kaleen Mask, LPN Sent: [id number]   9:20 AM EST To: Elam Dutch, MD Subject: PICC Line                                      Hey Dr. Oneida Alar.  Holly from [facility] is visiting this patient and wants to know if she can remove her picc line since she is done with her antibiotics?  Please advise.  Thanks,  Thurston Hole., LPN

## 2019-11-27 ENCOUNTER — Encounter: Payer: Self-pay | Admitting: Vascular Surgery

## 2019-11-27 ENCOUNTER — Ambulatory Visit (INDEPENDENT_AMBULATORY_CARE_PROVIDER_SITE_OTHER): Payer: Self-pay | Admitting: Vascular Surgery

## 2019-11-27 ENCOUNTER — Other Ambulatory Visit: Payer: Self-pay

## 2019-11-27 VITALS — BP 142/79 | HR 100 | Temp 97.8°F | Resp 20 | Ht 69.0 in | Wt 209.0 lb

## 2019-11-27 DIAGNOSIS — I6521 Occlusion and stenosis of right carotid artery: Secondary | ICD-10-CM

## 2019-11-27 NOTE — Progress Notes (Signed)
Patient is a 69 year old female who returns for postoperative follow-up today.  She underwent right carotid endarterectomy September 30, 2019.  She developed an infection of the patch and had this removed and replaced with a vein patch on October 17, 2019.  She has now completed a month course of IV antibiotics.  Her PICC line has been discontinued.  She has no symptoms of TIA amaurosis or stroke.  She is on aspirin and a statin.  Current Outpatient Medications on File Prior to Visit  Medication Sig Dispense Refill  . amLODipine (NORVASC) 10 MG tablet Take 10 mg by mouth daily.     Marland Kitchen aspirin 81 MG EC tablet Take 81 mg by mouth at bedtime.     Marland Kitchen atorvastatin (LIPITOR) 10 MG tablet Take 10 mg by mouth daily.     . carboxymethylcellulose (REFRESH) 1 % ophthalmic solution Apply 1-2 drops to eye 3 (three) times daily as needed (DRY/IRRITATED EYES.).     Marland Kitchen cetirizine (ZYRTEC) 10 MG tablet Take 10 mg by mouth at bedtime.     . Cholecalciferol (VITAMIN D) 2000 UNITS tablet Take 2,000 Units by mouth daily.     . Cholecalciferol (VITAMIN D3) 50 MCG (2000 UT) TABS Take 2,000 Units by mouth.    . diphenhydramine-acetaminophen (TYLENOL PM) 25-500 MG TABS tablet Take 1 tablet by mouth at bedtime.    . fluticasone (FLONASE) 50 MCG/ACT nasal spray Place 1-2 sprays into both nostrils daily as needed for allergies or rhinitis.    Marland Kitchen HYDROcodone-acetaminophen (NORCO/VICODIN) 5-325 MG tablet Take 1-2 tablets by mouth every 4 (four) hours as needed for moderate pain. 30 tablet 0  . losartan-hydrochlorothiazide (HYZAAR) 50-12.5 MG tablet Take 1 tablet by mouth 2 (two) times daily.    . metFORMIN (GLUCOPHAGE) 1000 MG tablet Take 1,000 mg by mouth at bedtime.     . montelukast (SINGULAIR) 10 MG tablet Take 10 mg by mouth daily.     . Probiotic Product (PROBIOTIC ADVANCED PO) Take by mouth.    . sitaGLIPtin-metformin (JANUMET) 50-500 MG tablet Take 2 tablets by mouth daily. SAMPLE     No current facility-administered  medications on file prior to visit.     Physical exam:  Vitals:   11/27/19 0912  BP: (!) 142/79  Pulse: 100  Resp: 20  Temp: 97.8 F (36.6 C)  SpO2: 98%  Weight: 209 lb (94.8 kg)  Height: 5\' 9"  (1.753 m)    Neck: Healing right neck incision no drainage no erythema  Extremities: Healing right groin incision no erythema no drainage  Neuro: Symmetric upper extremity lower extremity motor strength 5/5 no facial asymmetry  Assessment: Doing well status post right carotid endarterectomy  Plan: Follow-up 8 months with carotid duplex scan and be seen in our PA clinic.  She has no restrictions at this point.  Ruta Hinds, MD [facility] [place] Office: [phone]

## 2019-11-28 ENCOUNTER — Other Ambulatory Visit: Payer: Self-pay | Admitting: *Deleted

## 2019-11-28 DIAGNOSIS — I6523 Occlusion and stenosis of bilateral carotid arteries: Secondary | ICD-10-CM

## 2019-12-16 ENCOUNTER — Telehealth: Payer: Self-pay

## 2019-12-16 NOTE — Telephone Encounter (Signed)
Pt called to say [employer] have denied her [facility] charges and to call her ordering Physician regarding the help she received for her Picc line.  Pt says this service was requested by Dr. Oneida Alar.  I advised her to also contact [facility] and we will also follow up on our end.  Thurston Hole., LPN

## 2019-12-24 NOTE — Telephone Encounter (Addendum)
Blanchard Mane, Division Mgr with [facility] emailed back regarding this patient's account.  Sharyn Lull has not been able to reach the patient despite several calls.  I tried calling patient's cell and home phone number also.  I left a message to contact Sharyn Lull at [phone] if she has not already to try to resolve her billing issue.  Thurston Hole., LPN

## 2020-01-27 DIAGNOSIS — Z1231 Encounter for screening mammogram for malignant neoplasm of breast: Secondary | ICD-10-CM | POA: Diagnosis not present

## 2020-03-05 DIAGNOSIS — E119 Type 2 diabetes mellitus without complications: Secondary | ICD-10-CM | POA: Diagnosis not present

## 2020-03-10 DIAGNOSIS — E785 Hyperlipidemia, unspecified: Secondary | ICD-10-CM | POA: Diagnosis not present

## 2020-03-10 DIAGNOSIS — R0989 Other specified symptoms and signs involving the circulatory and respiratory systems: Secondary | ICD-10-CM | POA: Diagnosis not present

## 2020-03-10 DIAGNOSIS — E782 Mixed hyperlipidemia: Secondary | ICD-10-CM | POA: Diagnosis not present

## 2020-03-10 DIAGNOSIS — E119 Type 2 diabetes mellitus without complications: Secondary | ICD-10-CM | POA: Diagnosis not present

## 2020-03-10 DIAGNOSIS — Z79899 Other long term (current) drug therapy: Secondary | ICD-10-CM | POA: Diagnosis not present

## 2020-03-10 DIAGNOSIS — R000 Tachycardia, unspecified: Secondary | ICD-10-CM | POA: Diagnosis not present

## 2020-03-10 DIAGNOSIS — I10 Essential (primary) hypertension: Secondary | ICD-10-CM | POA: Diagnosis not present

## 2020-03-10 DIAGNOSIS — E669 Obesity, unspecified: Secondary | ICD-10-CM | POA: Diagnosis not present

## 2020-03-10 DIAGNOSIS — Z1331 Encounter for screening for depression: Secondary | ICD-10-CM | POA: Diagnosis not present

## 2020-03-10 DIAGNOSIS — E871 Hypo-osmolality and hyponatremia: Secondary | ICD-10-CM | POA: Diagnosis not present

## 2020-03-10 DIAGNOSIS — H579 Unspecified disorder of eye and adnexa: Secondary | ICD-10-CM | POA: Diagnosis not present

## 2020-05-26 DIAGNOSIS — E871 Hypo-osmolality and hyponatremia: Secondary | ICD-10-CM | POA: Diagnosis not present

## 2020-07-06 DIAGNOSIS — I10 Essential (primary) hypertension: Secondary | ICD-10-CM | POA: Diagnosis not present

## 2020-07-06 DIAGNOSIS — E871 Hypo-osmolality and hyponatremia: Secondary | ICD-10-CM | POA: Diagnosis not present

## 2020-07-06 DIAGNOSIS — E119 Type 2 diabetes mellitus without complications: Secondary | ICD-10-CM | POA: Diagnosis not present

## 2020-07-06 DIAGNOSIS — R590 Localized enlarged lymph nodes: Secondary | ICD-10-CM | POA: Diagnosis not present

## 2020-08-26 ENCOUNTER — Other Ambulatory Visit: Payer: Self-pay | Admitting: *Deleted

## 2020-08-26 DIAGNOSIS — I6523 Occlusion and stenosis of bilateral carotid arteries: Secondary | ICD-10-CM

## 2020-09-13 ENCOUNTER — Other Ambulatory Visit: Payer: Self-pay

## 2020-09-13 ENCOUNTER — Ambulatory Visit (INDEPENDENT_AMBULATORY_CARE_PROVIDER_SITE_OTHER): Payer: PPO | Admitting: Physician Assistant

## 2020-09-13 ENCOUNTER — Ambulatory Visit (HOSPITAL_COMMUNITY)
Admission: RE | Admit: 2020-09-13 | Discharge: 2020-09-13 | Disposition: A | Discharge status: Home / Self Care | Payer: PPO | Source: Ambulatory Visit | Attending: Vascular Surgery | Admitting: Vascular Surgery

## 2020-09-13 VITALS — BP 132/74 | HR 104 | Temp 97.7°F | Resp 16 | Ht 69.0 in | Wt 209.0 lb

## 2020-09-13 DIAGNOSIS — T8579XA Infection and inflammatory reaction due to other internal prosthetic devices, implants and grafts, initial encounter: Secondary | ICD-10-CM

## 2020-09-13 DIAGNOSIS — I6523 Occlusion and stenosis of bilateral carotid arteries: Secondary | ICD-10-CM | POA: Diagnosis not present

## 2020-09-13 DIAGNOSIS — I6521 Occlusion and stenosis of right carotid artery: Secondary | ICD-10-CM | POA: Diagnosis not present

## 2020-09-13 NOTE — Progress Notes (Signed)
Office Note     CC:  follow up Requesting Provider:  Vertell Novak*  HPI: Kristin Byrd is a 70 y.o. (1951-07-26) female who presents for follow up of carotid disease. She underwent right carotid endarterectomy January 19th 2021 by Dr. Darrick Penna. She subsequently developed an infection of the patch and had to have the patch removed and replaced with a vein patch on February 5th 2021 by Dr. Darrick Penna. Following this procedure was treated with 1 month of IV antibiotics via a PICC line. She was last seen in March 2021 by Dr. Darrick Penna at which time she was doing very well with no recurrent signs of infection and no new stoke or TIA like symptoms.   She presents today for her 9 month follow up and carotid duplex. She has no symptoms of TIA or stroke. She denies any amaurosis fugax, monocular blindness, slurred speech, facial drooping, weakness or numbness of upper or lower extremities.  The pt is on a statin for cholesterol management.  The pt is on a daily aspirin.   Other AC: none The pt is on CCB, HYZAAR for hypertension.   The pt is diabetic.   Tobacco hx:  never  Past Medical History:  Diagnosis Date  . Arthritis   . Diabetes mellitus   . Hypertension   . PONV (postoperative nausea and vomiting)     Past Surgical History:  Procedure Laterality Date  . CHOLECYSTECTOMY    . DENTAL SURGERY    . DILATION AND CURETTAGE OF UTERUS    . ENDARTERECTOMY Right 09/30/2019   Procedure: RIGHT ENDARTERECTOMY CAROTID;  Surgeon: Sherren Kerns, MD;  Location: [facility] OR;  Service: Vascular;  Laterality: Right;  . ENDARTERECTOMY Right 10/17/2019   Procedure: Incision and Drainage Right Neck, Removal Dacron Patch from Right Carotid Artery, Vein Patch Angioplasty Right Carotid Artery; Harvest Right Greater Saphenous Vein Graft;  Surgeon: Sherren Kerns, MD;  Location: [facility] OR;  Service: Vascular;  Laterality: Right;  . EYE SURGERY     per pt lasik eye surgery bilateral on 09/26/19  . L WIRST      CYST REMOVED  . PATCH ANGIOPLASTY Right 09/30/2019   Procedure: PATCH ANGIOPLASTY USING HEMASHIELD PLATINUM FINESSE SHIELD;  Surgeon: Sherren Kerns, MD;  Location: [facility] OR;  Service: Vascular;  Laterality: Right;  . TOTAL HIP ARTHROPLASTY  08/15/2011   Procedure: TOTAL HIP ARTHROPLASTY ANTERIOR APPROACH;  Surgeon: Shelda Pal;  Location: [facility] ORS;  Service: Orthopedics;  Laterality: Right;  . TOTAL HIP ARTHROPLASTY Left 08/04/2014   Procedure: LEFT TOTAL HIP ARTHROPLASTY ANTERIOR APPROACH;  Surgeon: Shelda Pal, MD;  Location: [facility] ORS;  Service: Orthopedics;  Laterality: Left;    Social History   Socioeconomic History  . Marital status: Married    Spouse name: Not on file  . Number of children: Not on file  . Years of education: Not on file  . Highest education level: Not on file  Occupational History  . Not on file  Tobacco Use  . Smoking status: Never Smoker  . Smokeless tobacco: Never Used  Vaping Use  . Vaping Use: Never used  Substance and Sexual Activity  . Alcohol use: Yes    Comment: rarely  . Drug use: No  . Sexual activity: Yes  Other Topics Concern  . Not on file  Social History Narrative  . Not on file   Social Determinants of Health   Financial Resource Strain: Not on file  Food Insecurity: Not on  file  Transportation Needs: Not on file  Physical Activity: Not on file  Stress: Not on file  Social Connections: Not on file  Intimate Partner Violence: Not on file    Family History  Problem Relation Age of Onset  . Stroke Father     Current Outpatient Medications  Medication Sig Dispense Refill  . amLODipine (NORVASC) 10 MG tablet Take 10 mg by mouth daily.     Marland Kitchen aspirin 81 MG EC tablet Take 81 mg by mouth at bedtime.     Marland Kitchen atorvastatin (LIPITOR) 10 MG tablet Take 10 mg by mouth daily.     . carboxymethylcellulose (REFRESH) 1 % ophthalmic solution Apply 1-2 drops to eye 3 (three) times daily as needed (DRY/IRRITATED EYES.).     Marland Kitchen cetirizine (ZYRTEC)  10 MG tablet Take 10 mg by mouth at bedtime.     . Cholecalciferol (VITAMIN D) 2000 UNITS tablet Take 2,000 Units by mouth daily.     . Cholecalciferol (VITAMIN D3) 50 MCG (2000 UT) TABS Take 2,000 Units by mouth.    . diphenhydramine-acetaminophen (TYLENOL PM) 25-500 MG TABS tablet Take 1 tablet by mouth at bedtime.    . fluticasone (FLONASE) 50 MCG/ACT nasal spray Place 1-2 sprays into both nostrils daily as needed for allergies or rhinitis.    Marland Kitchen HYDROcodone-acetaminophen (NORCO/VICODIN) 5-325 MG tablet Take 1-2 tablets by mouth every 4 (four) hours as needed for moderate pain. 30 tablet 0  . losartan-hydrochlorothiazide (HYZAAR) 50-12.5 MG tablet Take 1 tablet by mouth 2 (two) times daily.    . metFORMIN (GLUCOPHAGE) 1000 MG tablet Take 1,000 mg by mouth at bedtime.     . montelukast (SINGULAIR) 10 MG tablet Take 10 mg by mouth daily.     . Probiotic Product (PROBIOTIC ADVANCED PO) Take by mouth.    . sitaGLIPtin-metformin (JANUMET) 50-500 MG tablet Take 2 tablets by mouth daily. SAMPLE     No current facility-administered medications for this visit.    Allergies  Allergen Reactions  . Hydrocodone Itching  . Oxycodone Itching  . Shellfish Allergy Nausea And Vomiting  . Sulfamethoxazole Nausea Only     REVIEW OF SYSTEMS:  [X]  denotes positive finding, [ ]  denotes negative finding Cardiac  Comments:  Chest pain or chest pressure:    Shortness of breath upon exertion:    Short of breath when lying flat:    Irregular heart rhythm:        Vascular    Pain in calf, thigh, or hip brought on by ambulation:    Pain in feet at night that wakes you up from your sleep:     Blood clot in your veins:    Leg swelling:         Pulmonary    Oxygen at home:    Productive cough:     Wheezing:         Neurologic    Sudden weakness in arms or legs:     Sudden numbness in arms or legs:     Sudden onset of difficulty speaking or slurred speech:    Temporary loss of vision in one eye:      Problems with dizziness:         Gastrointestinal    Blood in stool:     Vomited blood:         Genitourinary    Burning when urinating:     Blood in urine:        Psychiatric    Major  depression:         Hematologic    Bleeding problems:    Problems with blood clotting too easily:        Skin    Rashes or ulcers:        Constitutional    Fever or chills:      PHYSICAL EXAMINATION:  Vitals:   09/13/20 0936  BP: (!) 147/79  Pulse: 100  Resp: 16  Temp: 97.7 F (36.5 C)  TempSrc: Temporal  SpO2: 99%  Weight: 209 lb (94.8 kg)  Height: 5\' 9"  (1.753 m)    General:  WDWN in NAD; vital signs documented above Gait: Normal HENT: WNL, normocephalic Pulmonary: normal non-labored breathing, without wheezing Cardiac: regular HR, without  Murmurs without carotid bruit. Right CEA incision nicely healed Vascular Exam/Pulses:2+ radial pulses bilaterally, 2+ DP pulses bilaterally. Feet warm and well perfused Extremities: without ischemic changes, without Gangrene , without cellulitis; without open wounds;  Musculoskeletal: no muscle wasting or atrophy  Neurologic: A&O X 3;  No focal weakness or paresthesias are detected Psychiatric:  The pt has Normal affect.   Non-Invasive Vascular Imaging:   Right Carotid: There is no evidence of stenosis in the right ICA.   Left Carotid: Velocities in the left ICA are consistent with a 40-59% stenosis.   Vertebrals: Bilateral vertebral arteries demonstrate antegrade flow.     ASSESSMENT/PLAN:: 70 y.o. female here for follow up for follow up of carotid stenosis. She is s/p right CEA on January 19th 2021 by Dr. Oneida Alar. She subsequently developed an infection of the patch and had to have the patch removed and replaced with a vein patch on February 5th 2021 by Dr. Oneida Alar. She has been doing well post operatively. She remains asymptomatic. Her duplex today shows stable right surgically altered ICA without stenosis and left ICA velocities are  essentially unchanged from prior study. - She will continue her aspirin and statin - Reviewed signs and symptoms of TIA/ Stroke and should these occur she knows to seek immediate medical attention -She will follow up in 1 year with Carotid Duplex   Karoline Caldwell, PA-C [facility] [phone]  Clinic MD:  Dr. Trula Slade

## 2020-10-28 DIAGNOSIS — E119 Type 2 diabetes mellitus without complications: Secondary | ICD-10-CM | POA: Diagnosis not present

## 2020-11-08 DIAGNOSIS — I10 Essential (primary) hypertension: Secondary | ICD-10-CM | POA: Diagnosis not present

## 2020-11-08 DIAGNOSIS — E871 Hypo-osmolality and hyponatremia: Secondary | ICD-10-CM | POA: Diagnosis not present

## 2020-11-08 DIAGNOSIS — E119 Type 2 diabetes mellitus without complications: Secondary | ICD-10-CM | POA: Diagnosis not present

## 2020-11-19 DIAGNOSIS — L578 Other skin changes due to chronic exposure to nonionizing radiation: Secondary | ICD-10-CM | POA: Diagnosis not present

## 2020-11-19 DIAGNOSIS — L821 Other seborrheic keratosis: Secondary | ICD-10-CM | POA: Diagnosis not present

## 2020-11-19 DIAGNOSIS — L820 Inflamed seborrheic keratosis: Secondary | ICD-10-CM | POA: Diagnosis not present

## 2020-11-24 IMAGING — CT CT ANGIO NECK
1 of 11 series · 5 of 33 positions shown · IV contrast (OMNI 350)
Comparison: Carotid artery duplex 08/22/2019

CLINICAL DATA: Carotid stenosis, bilateral.

EXAM:
CT ANGIOGRAPHY HEAD AND NECK
TECHNIQUE: Multidetector CT imaging of the head and neck was performed using
the standard protocol during bolus administration of intravenous
contrast. Multiplanar CT image reconstructions and MIPs were
obtained to evaluate the vascular anatomy. Carotid stenosis
measurements (when applicable) are obtained utilizing NASCET
criteria, using the distal internal carotid diameter as the
denominator.
CONTRAST:  75mL OMNIPAQUE IOHEXOL 350 MG/ML SOLN

[Series 12: cta neck axial · axial · 0.39mm/px · z∈[-272,-45]mm · 5 of 358 slices shown]
[im 60/358  soft-tissue]
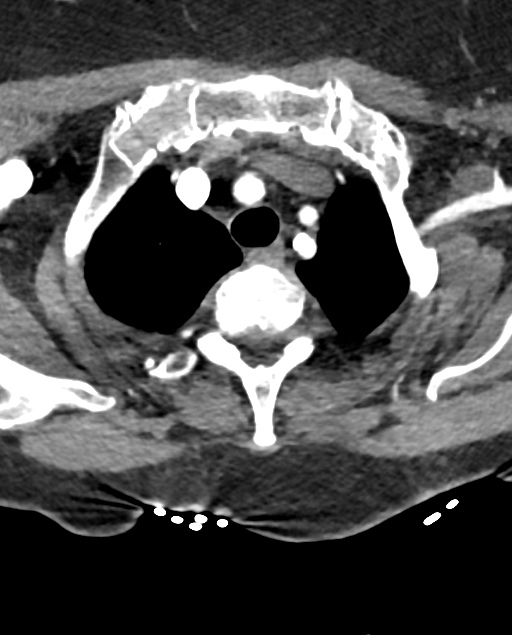
[im 120/358  bone]
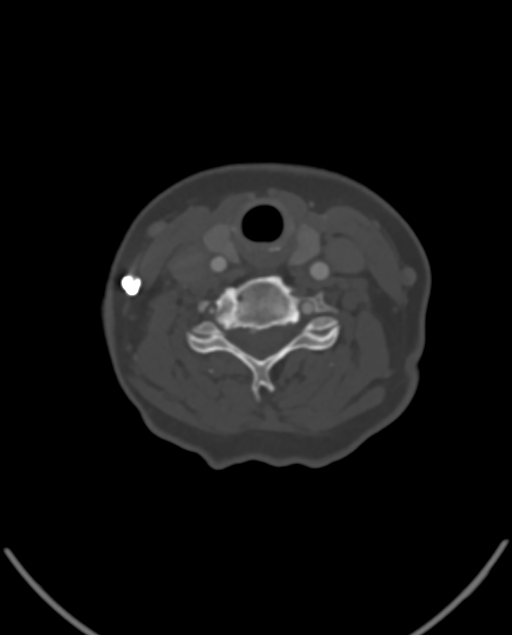
[im 179/358  soft-tissue]
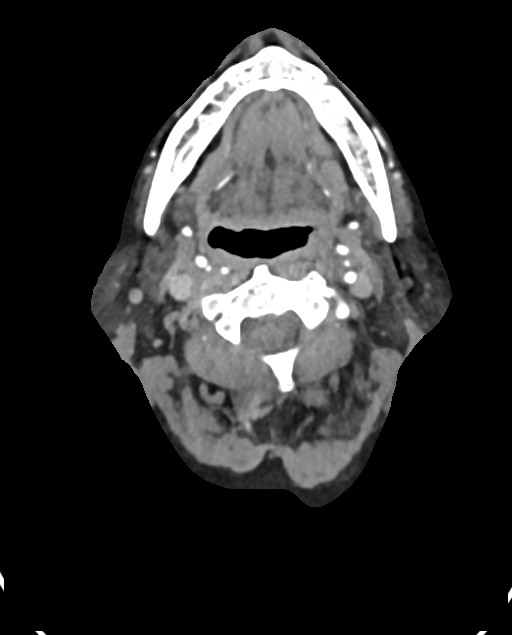
[im 239/358  bone]
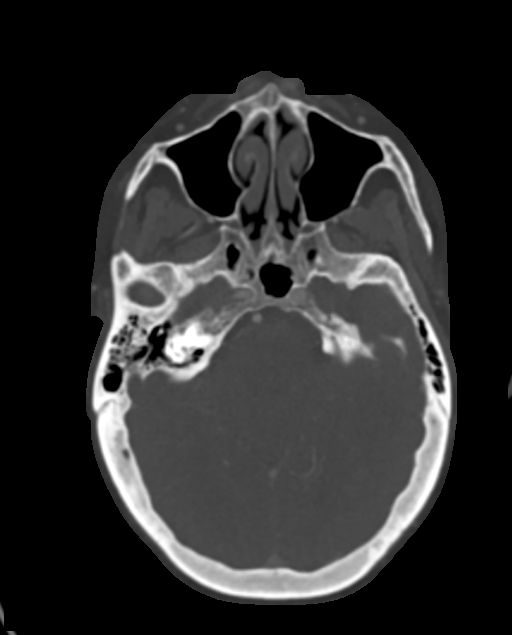
[im 298/358  soft-tissue]
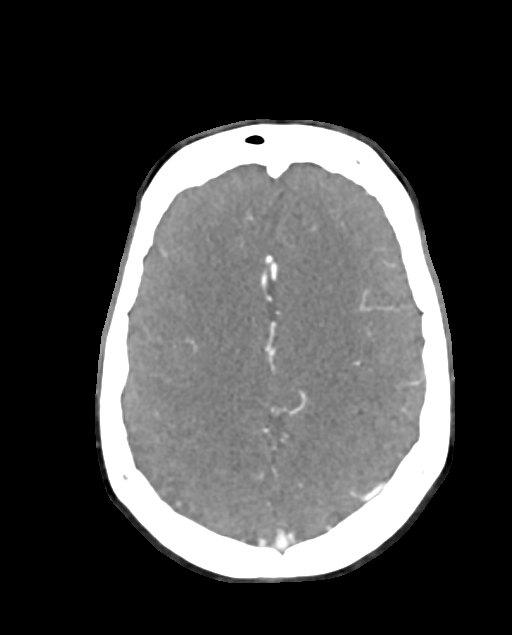

[5 of 33 positions shown; findings below may reference images not displayed]

FINDINGS: CT HEAD FINDINGS

Brain:

No evidence of acute intracranial hemorrhage.

No demarcated cortical infarction.

No evidence of intracranial mass.

No midline shift or extra-axial fluid collection.

Cerebral volume is normal for age.

Vascular: Reported separately.

Skull: Normal. Negative for fracture or focal lesion.

Sinuses: No significant paranasal sinus disease or mastoid effusion.

Orbits: Visualized orbits demonstrate no acute abnormality.

Review of the MIP images confirms the above findings

CTA NECK FINDINGS

Aortic arch: Common origin of the innominate and left common carotid
arteries mixed plaque within the visualized aortic arch and proximal
major branch vessels of the neck. Plaque within the proximal left
subclavian artery results in less than 50% stenosis.

Right carotid system: Common carotid artery patent to the
bifurcation without significant stenosis. Severe mixed plaque within
the carotid bifurcation and extending into the proximal ICA.
Resultant severe stenosis at the level of the carotid bifurcation.
There is also severe, near complete stenosis within the proximal
ICA. Distal to this, the right ICA remains asymmetrically diminutive
but is patent.

Left carotid system: Common carotid artery patent to the bifurcation
without significant stenosis. Moderate calcified plaque within the
carotid bifurcation and extending into the proximal ICA. Estimated
stenosis of the proximal ICA of 30-40% distal to this, the ICA is
patent within the neck without significant stenosis. As compared to
the more distal vessel.

Vertebral arteries: The non dominant right vertebral artery is
functionally occluded from its origin through the V2 segment with
little if any irregular opacification seen. Portions of the proximal
V3 segment are reconstituted, although occluding again before the
intracranial segment. Portions of the V4 right vertebral artery are
opacified distally, likely via retrograde flow.

Skeleton: No acute bony abnormality. Cervical spondylosis with
multilevel posterior disc osteophytes, uncovertebral and facet
hypertrophy. Prominent C5-C6 posterior disc osteophyte complex
contributing to suspected severe spinal canal stenosis. Markedly
dominant left vertebral artery patent throughout the neck without
significant stenosis.

Other neck: No cervical lymphadenopathy. Bilateral subcentimeter
thyroid nodules.

Upper chest: No consolidation within the imaged lung apices.

Review of the MIP images confirms the above findings

CTA HEAD FINDINGS

Anterior circulation:

The intracranial internal carotid arteries are patent with scattered
calcified plaque. Sites of mild/moderate stenosis within the
cavernous/paraclinoid right ICA. No more than mild stenosis within
the cavernous left ICA.

The right middle cerebral artery is patent without high-grade
proximal stenosis. Hypoplastic A1 right ACA. The right anterior
cerebral artery is otherwise patent without high-grade proximal
stenosis. The left middle and anterior cerebral arteries are patent
without high-grade proximal stenosis. No intracranial aneurysm is
identified.

Posterior circulation:

Reconstitution of the distal V4 right vertebral artery, likely via
retrograde flow. The dominant intracranial left vertebral artery is
patent without significant stenosis. There is opacification of the
proximal PICA vessels bilaterally. The basilar artery is patent
without significant stenosis. The bilateral posterior cerebral
arteries are patent without high-grade proximal stenosis.

Venous sinuses: Within limitations of contrast timing, no convincing
thrombus.

Anatomic variants: Posterior communicating arteries are hypoplastic
or absent bilaterally.

Review of the MIP images confirms the above findings
IMPRESSION: CT head:

Normal noncontrast CT appearance of the brain for age. No evidence
of acute intracranial abnormality.

CTA neck:

1. Severe atherosclerotic plaque within the right carotid
bifurcation and proximal right ICA. Severe stenosis of the carotid
bifurcation as well as severe, near complete stenosis of the
proximal right ICA. Distally, the right ICA remains diminutive
within the neck, but patent.
2. Moderate mixed plaque within the left carotid bifurcation and
proximal ICA. 30-40% stenosis of the proximal left ICA.
3. Non dominant right vertebral artery functionally occluded
throughout the majority of the neck.
4. Significantly dominant left vertebral artery patent throughout
the neck without stenosis.
5. Cervical spondylosis. A prominent C5-C6 posterior disc osteophyte
complex contributes to suspected severe spinal canal stenosis.
Consider dedicated cervical spine MRI for further evaluation.

CTA head:

1. Atherosclerotic disease within the intracranial internal carotid
arteries. Sites of mild/moderate stenosis within the
cavernous/paraclinoid right ICA. No more than mild stenosis within
the cavernous/paraclinoid left ICA.
2. Reconstitution of the distal V4 right vertebral artery, likely
via retrograde flow. Flow is seen within the proximal right PICA.
The dominant intracranial left vertebral artery is patent without
stenosis.
3. No other significant proximal stenosis identified in the branch
vessels of the circle Willis.

## 2020-12-09 DIAGNOSIS — E119 Type 2 diabetes mellitus without complications: Secondary | ICD-10-CM | POA: Diagnosis not present

## 2020-12-27 DIAGNOSIS — H7291 Unspecified perforation of tympanic membrane, right ear: Secondary | ICD-10-CM | POA: Diagnosis not present

## 2020-12-27 DIAGNOSIS — Z6831 Body mass index (BMI) 31.0-31.9, adult: Secondary | ICD-10-CM | POA: Diagnosis not present

## 2020-12-29 ENCOUNTER — Ambulatory Visit (INDEPENDENT_AMBULATORY_CARE_PROVIDER_SITE_OTHER): Payer: PPO | Admitting: Otolaryngology

## 2020-12-29 ENCOUNTER — Other Ambulatory Visit: Payer: Self-pay

## 2020-12-29 DIAGNOSIS — H7291 Unspecified perforation of tympanic membrane, right ear: Secondary | ICD-10-CM

## 2020-12-29 DIAGNOSIS — H60311 Diffuse otitis externa, right ear: Secondary | ICD-10-CM | POA: Diagnosis not present

## 2020-12-29 DIAGNOSIS — J310 Chronic rhinitis: Secondary | ICD-10-CM | POA: Diagnosis not present

## 2020-12-29 NOTE — Progress Notes (Signed)
HPI: Kristin Byrd is a 70 y.o. female who presents is referred by Dr. Lin Landsman for evaluation of ruptured right TM.  Patient has bad allergies and takes Singulair Zyrtec and Flonase.  This past week allergies were a little more severe and she developed pressure and pain in the right ear and has some drainage and bleeding from the right ear.  She was diagnosed with a ruptured right TM and was started on cefdinir 300 mg twice daily for 1 week and referred here..  Past Medical History:  Diagnosis Date  . Arthritis   . Diabetes mellitus   . Hypertension   . PONV (postoperative nausea and vomiting)    Past Surgical History:  Procedure Laterality Date  . CHOLECYSTECTOMY    . DENTAL SURGERY    . DILATION AND CURETTAGE OF UTERUS    . ENDARTERECTOMY Right 09/30/2019   Procedure: RIGHT ENDARTERECTOMY CAROTID;  Surgeon: Elam Dutch, MD;  Location: [place];  Service: Vascular;  Laterality: Right;  . ENDARTERECTOMY Right 10/17/2019   Procedure: Incision and Drainage Right Neck, Removal Dacron Patch from Right Carotid Artery, Vein Patch Angioplasty Right Carotid Artery; Harvest Right Greater Saphenous Vein Graft;  Surgeon: Elam Dutch, MD;  Location: [facility] OR;  Service: Vascular;  Laterality: Right;  . EYE SURGERY     per pt lasik eye surgery bilateral on 09/26/19  . L WIRST     CYST REMOVED  . PATCH ANGIOPLASTY Right 09/30/2019   Procedure: [place];  Surgeon: Elam Dutch, MD;  Location: [place];  Service: Vascular;  Laterality: Right;  . TOTAL HIP ARTHROPLASTY  08/15/2011   Procedure: TOTAL HIP ARTHROPLASTY ANTERIOR APPROACH;  Surgeon: Mauri Pole;  Location: [facility] ORS;  Service: Orthopedics;  Laterality: Right;  . TOTAL HIP ARTHROPLASTY Left 08/04/2014   Procedure: LEFT TOTAL HIP ARTHROPLASTY ANTERIOR APPROACH;  Surgeon: Mauri Pole, MD;  Location: [facility] ORS;  Service: Orthopedics;  Laterality: Left;   Social History   Socioeconomic  History  . Marital status: Married    Spouse name: Not on file  . Number of children: Not on file  . Years of education: Not on file  . Highest education level: Not on file  Occupational History  . Not on file  Tobacco Use  . Smoking status: Never Smoker  . Smokeless tobacco: Never Used  Vaping Use  . Vaping Use: Never used  Substance and Sexual Activity  . Alcohol use: Yes    Comment: rarely  . Drug use: No  . Sexual activity: Yes  Other Topics Concern  . Not on file  Social History Narrative  . Not on file   Social Determinants of Health   Financial Resource Strain: Not on file  Food Insecurity: Not on file  Transportation Needs: Not on file  Physical Activity: Not on file  Stress: Not on file  Social Connections: Not on file   Family History  Problem Relation Age of Onset  . Stroke Father    Allergies  Allergen Reactions  . Hydrocodone Itching  . Oxycodone Itching  . Shellfish Allergy Nausea And Vomiting  . Sulfamethoxazole Nausea Only   Prior to Admission medications   Medication Sig Start Date End Date Taking? Authorizing Provider  amLODipine (NORVASC) 10 MG tablet Take 10 mg by mouth daily.     [provider]  aspirin 81 MG EC tablet Take 81 mg by mouth at bedtime.     [provider]  atorvastatin (LIPITOR) 10 MG tablet Take 10 mg by mouth daily.     [provider]  carboxymethylcellulose 1 % ophthalmic solution Apply 1-2 drops to eye 3 (three) times daily as needed (DRY/IRRITATED EYES.).    [provider]  cetirizine (ZYRTEC) 10 MG tablet Take 10 mg by mouth at bedtime.    [provider]  Cholecalciferol (VITAMIN D) 2000 UNITS tablet Take 2,000 Units by mouth daily.    [provider]  Cholecalciferol (VITAMIN D3) 50 MCG (2000 UT) TABS Take 2,000 Units by mouth.    [provider]  diphenhydramine-acetaminophen (TYLENOL PM) 25-500 MG TABS tablet Take 1 tablet by mouth at bedtime.     [provider]  fluticasone (FLONASE) 50 MCG/ACT nasal spray Place 1-2 sprays into both nostrils daily as needed for allergies or rhinitis.    [provider]  HYDROcodone-acetaminophen (NORCO/VICODIN) 5-325 MG tablet Take 1-2 tablets by mouth every 4 (four) hours as needed for moderate pain. Patient not taking: Reported on 09/13/2020 10/23/19   Ulyses Amor, PA-C  losartan-hydrochlorothiazide (HYZAAR) 50-12.5 MG tablet Take 1 tablet by mouth 2 (two) times daily.    [provider]  metFORMIN (GLUCOPHAGE) 1000 MG tablet Take 1,000 mg by mouth at bedtime.  10/12/17   [provider]  montelukast (SINGULAIR) 10 MG tablet Take 10 mg by mouth daily.    [provider]  Probiotic Product (PROBIOTIC ADVANCED PO) Take by mouth.    [provider]  sitaGLIPtin-metformin (JANUMET) 50-500 MG tablet Take 2 tablets by mouth daily. SAMPLE    [provider]     Positive ROS: Otherwise negative  All other systems have been reviewed and were otherwise negative with the exception of those mentioned in the HPI and as above.  Physical Exam: Constitutional: Alert, well-appearing, no acute distress Ears: External ears without lesions or tenderness.  Left ear canal left TM are clear.  Right ear canal reveals some drainage within the ear canal that was cleaned with suction.  She had a small little blood clot on the TM posteriorly superiorly that was also suctioned and cleaned.  She is having no active drainage presently.  The TM has reasonable mobility and on hearing screening with a tuning forks she heard reasonably well on the right side with AC > BC on the right side. Nasal: External nose without lesions. Septum slightly deviated to the right.  Mild rhinitis..  No obvious mucopurulent discharge noted with within the nasal cavity. Oral: Lips and gums without lesions. Tongue and palate mucosa without lesions. Posterior oropharynx clear. Neck: No palpable  adenopathy or masses Respiratory: Breathing comfortably  Skin: No facial/neck lesions or rash noted.  Cerumen impaction removal  Date/Time: 12/29/2020 1:21 PM Performed by: Rozetta Nunnery, MD Authorized by: Rozetta Nunnery, MD   Consent:    Consent obtained:  Verbal   Consent given by:  Patient   Risks discussed:  Pain and bleeding Procedure details:    Location:  R ear   Procedure type: suction   Post-procedure details:    Hearing quality:  Improved   Patient tolerance of procedure:  Tolerated well, no immediate complications Comments:     The right ear canal has some drainage that was cleaned with suction.  She had a small perforation of the posterior superior right TM with little bit of blood clot that was also removed.  The TM otherwise had reasonable mobility on pneumatic otoscopy.  And the perforation seems to be  healing.    Assessment: Ruptured right TM. Allergic rhinitis  Plan: Ear canal was cleaned in the office today and applied antibiotic eardrops to the right ear.  The ear is feeling better. She will complete the remaining antibiotics and also prescribed Ciprodex eardrops 4 drops twice daily for 5days.  I would expect this to spontaneously heal with no problems. She will notify us if she continues to have any problems with the right ear such as drainage or pain beyond 5 days. Recommended regular use of the Flonase as this will help provide better eustachian tube function.   Radene Journey, MD   CC:

## 2021-01-03 ENCOUNTER — Telehealth (INDEPENDENT_AMBULATORY_CARE_PROVIDER_SITE_OTHER): Payer: Self-pay

## 2021-01-03 ENCOUNTER — Other Ambulatory Visit (INDEPENDENT_AMBULATORY_CARE_PROVIDER_SITE_OTHER): Payer: Self-pay

## 2021-01-03 MED ORDER — OFLOXACIN 0.3 % OT SOLN: 5.0000 [drp] | Freq: Two times a day (BID) | OTIC | 0 refills | Status: AC

## 2021-01-03 MED ORDER — AMOXICILLIN-POT CLAVULANATE 875-125 MG PO TABS: 1.0000 | ORAL_TABLET | Freq: Two times a day (BID) | ORAL | 0 refills | Status: DC

## 2021-01-03 NOTE — Telephone Encounter (Signed)
Pt called in and spoke w/Sally, pt wanting cheaper drops and ear is still draining. Per Dr. Lucia Gaskins I sent in Augmentin 875 ZBID x 1 week and Ofloxacin Otic susp. For ears, 4 to 5 drops in infected ear BID x 1 week for drainage.

## 2021-01-10 DIAGNOSIS — L821 Other seborrheic keratosis: Secondary | ICD-10-CM | POA: Diagnosis not present

## 2021-01-10 DIAGNOSIS — C44729 Squamous cell carcinoma of skin of left lower limb, including hip: Secondary | ICD-10-CM | POA: Diagnosis not present

## 2021-02-09 DIAGNOSIS — D122 Benign neoplasm of ascending colon: Secondary | ICD-10-CM | POA: Diagnosis not present

## 2021-02-09 DIAGNOSIS — K635 Polyp of colon: Secondary | ICD-10-CM | POA: Diagnosis not present

## 2021-02-09 DIAGNOSIS — Z1211 Encounter for screening for malignant neoplasm of colon: Secondary | ICD-10-CM | POA: Diagnosis not present

## 2021-03-23 DIAGNOSIS — B9689 Other specified bacterial agents as the cause of diseases classified elsewhere: Secondary | ICD-10-CM | POA: Diagnosis not present

## 2021-03-23 DIAGNOSIS — J019 Acute sinusitis, unspecified: Secondary | ICD-10-CM | POA: Diagnosis not present

## 2021-03-23 DIAGNOSIS — Z20828 Contact with and (suspected) exposure to other viral communicable diseases: Secondary | ICD-10-CM | POA: Diagnosis not present

## 2021-04-04 ENCOUNTER — Ambulatory Visit (INDEPENDENT_AMBULATORY_CARE_PROVIDER_SITE_OTHER): Payer: PPO | Admitting: Otolaryngology

## 2021-04-04 ENCOUNTER — Other Ambulatory Visit: Payer: Self-pay

## 2021-04-04 DIAGNOSIS — H6521 Chronic serous otitis media, right ear: Secondary | ICD-10-CM

## 2021-04-04 NOTE — Progress Notes (Signed)
HPI: Kristin Byrd is a 70 y.o. female who returns today for evaluation of blockage of her right ear.  She has had a longstanding history of recurrent ear infections as well as history of ruptured right TM in the past.  Today she complains of blockage of her hearing on the right side and intermittent popping on the left side.  She has been using nasal steroid spray.. I had seen her previously 3 months ago with a ruptured right TM.  Past Medical History:  Diagnosis Date   Arthritis    Diabetes mellitus    Hypertension    PONV (postoperative nausea and vomiting)    Past Surgical History:  Procedure Laterality Date   CHOLECYSTECTOMY     DENTAL SURGERY     DILATION AND CURETTAGE OF UTERUS     ENDARTERECTOMY Right 09/30/2019   Procedure: RIGHT ENDARTERECTOMY CAROTID;  Surgeon: Elam Dutch, MD;  Location: [place];  Service: Vascular;  Laterality: Right;   ENDARTERECTOMY Right 10/17/2019   Procedure: Incision and Drainage Right Neck, Removal Dacron Patch from Right Carotid Artery, Vein Patch Angioplasty Right Carotid Artery; Harvest Right Greater Saphenous Vein Graft;  Surgeon: Elam Dutch, MD;  Location: [facility] OR;  Service: Vascular;  Laterality: Right;   EYE SURGERY     per pt lasik eye surgery bilateral on 09/26/19   L WIRST     CYST REMOVED   PATCH ANGIOPLASTY Right 09/30/2019   Procedure: PATCH ANGIOPLASTY [place];  Surgeon: Elam Dutch, MD;  Location: [place];  Service: Vascular;  Laterality: Right;   TOTAL HIP ARTHROPLASTY  08/15/2011   Procedure: TOTAL HIP ARTHROPLASTY ANTERIOR APPROACH;  Surgeon: Mauri Pole;  Location: [facility];  Service: Orthopedics;  Laterality: Right;   TOTAL HIP ARTHROPLASTY Left 08/04/2014   Procedure: LEFT TOTAL HIP ARTHROPLASTY ANTERIOR APPROACH;  Surgeon: Mauri Pole, MD;  Location: [facility];  Service: Orthopedics;  Laterality: Left;   Social History   Socioeconomic History   Marital status: Married     Spouse name: Not on file   Number of children: Not on file   Years of education: Not on file   Highest education level: Not on file  Occupational History   Not on file  Tobacco Use   Smoking status: Never   Smokeless tobacco: Never  Vaping Use   Vaping Use: Never used  Substance and Sexual Activity   Alcohol use: Yes    Comment: rarely   Drug use: No   Sexual activity: Yes  Other Topics Concern   Not on file  Social History Narrative   Not on file   Social Determinants of Health   Financial Resource Strain: Not on file  Food Insecurity: Not on file  Transportation Needs: Not on file  Physical Activity: Not on file  Stress: Not on file  Social Connections: Not on file   Family History  Problem Relation Age of Onset   Stroke Father    Allergies  Allergen Reactions   Hydrocodone Itching   Oxycodone Itching   Shellfish Allergy Nausea And Vomiting   Sulfamethoxazole Nausea Only   Prior to Admission medications   Medication Sig Start Date End Date Taking? Authorizing Provider  amLODipine (NORVASC) 10 MG tablet Take 10 mg by mouth daily.     [provider]  amoxicillin-clavulanate (AUGMENTIN) 875-125 MG tablet Take 1 tablet by mouth 2 (two) times daily. 01/03/21   Rozetta Nunnery, MD  aspirin 81 MG  EC tablet Take 81 mg by mouth at bedtime.     [provider]  atorvastatin (LIPITOR) 10 MG tablet Take 10 mg by mouth daily.     [provider]  carboxymethylcellulose 1 % ophthalmic solution Apply 1-2 drops to eye 3 (three) times daily as needed (DRY/IRRITATED EYES.).    [provider]  cetirizine (ZYRTEC) 10 MG tablet Take 10 mg by mouth at bedtime.    [provider]  Cholecalciferol (VITAMIN D) 2000 UNITS tablet Take 2,000 Units by mouth daily.    [provider]  Cholecalciferol (VITAMIN D3) 50 MCG (2000 UT) TABS Take 2,000 Units by mouth.    [provider]  diphenhydramine-acetaminophen (TYLENOL PM)  25-500 MG TABS tablet Take 1 tablet by mouth at bedtime.    [provider]  fluticasone (FLONASE) 50 MCG/ACT nasal spray Place 1-2 sprays into both nostrils daily as needed for allergies or rhinitis.    [provider]  HYDROcodone-acetaminophen (NORCO/VICODIN) 5-325 MG tablet Take 1-2 tablets by mouth every 4 (four) hours as needed for moderate pain. Patient not taking: Reported on 09/13/2020 10/23/19   Ulyses Amor, PA-C  losartan-hydrochlorothiazide (HYZAAR) 50-12.5 MG tablet Take 1 tablet by mouth 2 (two) times daily.    [provider]  metFORMIN (GLUCOPHAGE) 1000 MG tablet Take 1,000 mg by mouth at bedtime.  10/12/17   [provider]  montelukast (SINGULAIR) 10 MG tablet Take 10 mg by mouth daily.    [provider]  Probiotic Product (PROBIOTIC ADVANCED PO) Take by mouth.    [provider]  sitaGLIPtin-metformin (JANUMET) 50-500 MG tablet Take 2 tablets by mouth daily. SAMPLE    [provider]     Positive ROS: Otherwise negative  All other systems have been reviewed and were otherwise negative with the exception of those mentioned in the HPI and as above.  Physical Exam: Constitutional: Alert, well-appearing, no acute distress Ears: External ears without lesions or tenderness. Ear canals are clear bilaterally.  Left TM is clear.  Right TM has a serous otitis media with a retracted right TM that she is unable to "pop".  Because of chronic problems which have been worse on the right side I would hit and performed a right M&T in the office today.  Phenol was used as topical anesthetic.  A myringotomy was made the anterior portion of the TM and a serous effusion was aspirated.  A palpable type I tube was inserted.  She tolerated this well. Nasal: External nose without lesions. Septum with minimal deformity and mild rhinitis.  Both middle meatus regions were clear with no signs of active infection.. Clear nasal passages  otherwise. Oral: Lips and gums without lesions. Tongue and palate mucosa without lesions. Posterior oropharynx clear. Neck: No palpable adenopathy or masses Respiratory: Breathing comfortably  Skin: No facial/neck lesions or rash noted.  Myringotomy  Date/Time: 04/04/2021 5:11 PM Performed by: Rozetta Nunnery, MD Authorized by: Rozetta Nunnery, MD   Consent:    Consent obtained:  Verbal   Consent given by:  Patient Pre-procedure details:    Indications: serous otitis media   Anesthesia:    Anesthesia method:  Topical application   Topical anesthetic:  Phenol Procedure Details:    Hole made in:  Anteroinferior aspect of TM   Tube:  Paparella Type 1 Findings:    Fluid:  Serous fluid Post-procedure details:    Patient tolerance of procedure:  Tolerated well, no immediate complications Comments:  A palpable type I tube was inserted in the anterior portion of the right TM without difficulty.  Assessment: Patient with chronic ear problems especially on the right side.  On exam today she has a retracted right TM with a serous otitis media.  Plan: After discussion with the patient performed right M&T in the office today with a palpable type I tube Also reviewed with her concerning obtaining an [employer] that She can use to help "pop" her ears when she has difficulty with her ears. She will follow-up in 2 to 3 weeks for recheck of the right myringotomy tube.    Radene Journey, MD

## 2021-04-18 ENCOUNTER — Other Ambulatory Visit: Payer: Self-pay

## 2021-04-18 ENCOUNTER — Ambulatory Visit (INDEPENDENT_AMBULATORY_CARE_PROVIDER_SITE_OTHER): Payer: PPO | Admitting: Otolaryngology

## 2021-04-18 DIAGNOSIS — Z4889 Encounter for other specified surgical aftercare: Secondary | ICD-10-CM

## 2021-04-18 NOTE — Progress Notes (Signed)
HPI: Kristin Byrd is a 70 y.o. female who presents 10 days s/p right M&T with a palpable type I tube she is doing well with no drainage from the ear..   Past Medical History:  Diagnosis Date   Arthritis    Diabetes mellitus    Hypertension    PONV (postoperative nausea and vomiting)    Past Surgical History:  Procedure Laterality Date   CHOLECYSTECTOMY     DENTAL SURGERY     DILATION AND CURETTAGE OF UTERUS     ENDARTERECTOMY Right 09/30/2019   Procedure: RIGHT ENDARTERECTOMY CAROTID;  Surgeon: Elam Dutch, MD;  Location: [place];  Service: Vascular;  Laterality: Right;   ENDARTERECTOMY Right 10/17/2019   Procedure: Incision and Drainage Right Neck, Removal Dacron Patch from Right Carotid Artery, Vein Patch Angioplasty Right Carotid Artery; Harvest Right Greater Saphenous Vein Graft;  Surgeon: Elam Dutch, MD;  Location: [facility] OR;  Service: Vascular;  Laterality: Right;   EYE SURGERY     per pt lasik eye surgery bilateral on 09/26/19   L WIRST     CYST REMOVED   PATCH ANGIOPLASTY Right 09/30/2019   Procedure: PATCH ANGIOPLASTY [place];  Surgeon: Elam Dutch, MD;  Location: [place];  Service: Vascular;  Laterality: Right;   TOTAL HIP ARTHROPLASTY  08/15/2011   Procedure: TOTAL HIP ARTHROPLASTY ANTERIOR APPROACH;  Surgeon: Mauri Pole;  [facility];  Service: Orthopedics;  Laterality: Right;   TOTAL HIP ARTHROPLASTY Left 08/04/2014   Procedure: LEFT TOTAL HIP ARTHROPLASTY ANTERIOR APPROACH;  Surgeon: Mauri Pole, MD;  Location: [facility];  Service: Orthopedics;  Laterality: Left;   Social History   Socioeconomic History   Marital status: Married    Spouse name: Not on file   Number of children: Not on file   Years of education: Not on file   Highest education level: Not on file  Occupational History   Not on file  Tobacco Use   Smoking status: Never   Smokeless tobacco: Never  Vaping Use   Vaping Use: Never used   Substance and Sexual Activity   Alcohol use: Yes    Comment: rarely   Drug use: No   Sexual activity: Yes  Other Topics Concern   Not on file  Social History Narrative   Not on file   Social Determinants of Health   Financial Resource Strain: Not on file  Food Insecurity: Not on file  Transportation Needs: Not on file  Physical Activity: Not on file  Stress: Not on file  Social Connections: Not on file   Family History  Problem Relation Age of Onset   Stroke Father    Allergies  Allergen Reactions   Hydrocodone Itching   Oxycodone Itching   Shellfish Allergy Nausea And Vomiting   Sulfamethoxazole Nausea Only   Prior to Admission medications   Medication Sig Start Date End Date Taking? Authorizing Provider  amLODipine (NORVASC) 10 MG tablet Take 10 mg by mouth daily.     [provider]  amoxicillin-clavulanate (AUGMENTIN) 875-125 MG tablet Take 1 tablet by mouth 2 (two) times daily. 01/03/21   Rozetta Nunnery, MD  aspirin 81 MG EC tablet Take 81 mg by mouth at bedtime.     [provider]  atorvastatin (LIPITOR) 10 MG tablet Take 10 mg by mouth daily.     [provider]  carboxymethylcellulose 1 % ophthalmic solution Apply 1-2 drops to eye 3 (three) times daily  as needed (DRY/IRRITATED EYES.).    [provider]  cetirizine (ZYRTEC) 10 MG tablet Take 10 mg by mouth at bedtime.    [provider]  Cholecalciferol (VITAMIN D) 2000 UNITS tablet Take 2,000 Units by mouth daily.    [provider]  Cholecalciferol (VITAMIN D3) 50 MCG (2000 UT) TABS Take 2,000 Units by mouth.    [provider]  diphenhydramine-acetaminophen (TYLENOL PM) 25-500 MG TABS tablet Take 1 tablet by mouth at bedtime.    [provider]  fluticasone (FLONASE) 50 MCG/ACT nasal spray Place 1-2 sprays into both nostrils daily as needed for allergies or rhinitis.    [provider]  HYDROcodone-acetaminophen  (NORCO/VICODIN) 5-325 MG tablet Take 1-2 tablets by mouth every 4 (four) hours as needed for moderate pain. Patient not taking: Reported on 09/13/2020 10/23/19   Ulyses Amor, PA-C  losartan-hydrochlorothiazide (HYZAAR) 50-12.5 MG tablet Take 1 tablet by mouth 2 (two) times daily.    [provider]  metFORMIN (GLUCOPHAGE) 1000 MG tablet Take 1,000 mg by mouth at bedtime.  10/12/17   [provider]  montelukast (SINGULAIR) 10 MG tablet Take 10 mg by mouth daily.    [provider]  Probiotic Product (PROBIOTIC ADVANCED PO) Take by mouth.    [provider]  sitaGLIPtin-metformin (JANUMET) 50-500 MG tablet Take 2 tablets by mouth daily. SAMPLE    [provider]     Physical Exam: The Paparella type I tube is intact and patent and clear.  TM is clear. Left TM likewise is clear with no middle ear effusion noted   Assessment: S/p left M&T with a Paparella type I tube  Plan: She will follow-up as needed.   Radene Journey, MD

## 2021-05-26 DIAGNOSIS — E782 Mixed hyperlipidemia: Secondary | ICD-10-CM | POA: Diagnosis not present

## 2021-05-26 DIAGNOSIS — E669 Obesity, unspecified: Secondary | ICD-10-CM | POA: Diagnosis not present

## 2021-05-26 DIAGNOSIS — Z79899 Other long term (current) drug therapy: Secondary | ICD-10-CM | POA: Diagnosis not present

## 2021-05-26 DIAGNOSIS — Z0000 Encounter for general adult medical examination without abnormal findings: Secondary | ICD-10-CM | POA: Diagnosis not present

## 2021-05-26 DIAGNOSIS — Z8679 Personal history of other diseases of the circulatory system: Secondary | ICD-10-CM | POA: Diagnosis not present

## 2021-05-26 DIAGNOSIS — I10 Essential (primary) hypertension: Secondary | ICD-10-CM | POA: Diagnosis not present

## 2021-05-26 DIAGNOSIS — E119 Type 2 diabetes mellitus without complications: Secondary | ICD-10-CM | POA: Diagnosis not present

## 2021-07-13 DIAGNOSIS — Z20828 Contact with and (suspected) exposure to other viral communicable diseases: Secondary | ICD-10-CM | POA: Diagnosis not present

## 2021-07-13 DIAGNOSIS — R058 Other specified cough: Secondary | ICD-10-CM | POA: Diagnosis not present

## 2021-08-30 ENCOUNTER — Other Ambulatory Visit: Payer: Self-pay

## 2021-08-30 DIAGNOSIS — I6523 Occlusion and stenosis of bilateral carotid arteries: Secondary | ICD-10-CM

## 2021-09-15 NOTE — Progress Notes (Signed)
Office Note     CC:  follow up Requesting Provider:  Farrel Conners*  HPI: Kristin Byrd is a 71 y.o. ([birth year]) female who presents for follow up of carotid disease. She underwent right carotid endarterectomy January 19th 2021 by Dr. Oneida Alar. She subsequently developed an infection of the patch and had to have the patch removed and replaced with a vein patch on February 5th 2021 by Dr. Oneida Alar. Following this procedure was treated with 1 month of IV antibiotics via a PICC line. She was last seen in January 2022 doing well.   She presents today for follow up. She has no symptoms of TIA or stroke. She denies any amaurosis fugax, monocular blindness, slurred speech, facial drooping, weakness or numbness of upper or lower extremities. Is working out 5 days a week and working hard to live a healthy lifestyle.  The pt is on a statin for cholesterol management.  The pt is on a daily aspirin.   Other AC: none The pt is on CCB, HYZAAR for hypertension.   The pt is diabetic.   Tobacco hx:  never  Past Medical History:  Diagnosis Date   Arthritis    Diabetes mellitus    Hypertension    PONV (postoperative nausea and vomiting)     Past Surgical History:  Procedure Laterality Date   CHOLECYSTECTOMY     DENTAL SURGERY     DILATION AND CURETTAGE OF UTERUS     ENDARTERECTOMY Right 09/30/2019   Procedure: RIGHT ENDARTERECTOMY CAROTID;  Surgeon: Elam Dutch, MD;  Location: [facility] OR;  Service: Vascular;  Laterality: Right;   ENDARTERECTOMY Right 10/17/2019   Procedure: Incision and Drainage Right Neck, Removal Dacron Patch from Right Carotid Artery, Vein Patch Angioplasty Right Carotid Artery; Harvest Right Greater Saphenous Vein Graft;  Surgeon: Elam Dutch, MD;  Location: [facility] OR;  Service: Vascular;  Laterality: Right;   EYE SURGERY     per pt lasik eye surgery bilateral on 09/26/19   L WIRST     CYST REMOVED   PATCH ANGIOPLASTY Right 09/30/2019   Procedure: PATCH  ANGIOPLASTY [place];  Surgeon: Elam Dutch, MD;  Location: [place];  Service: Vascular;  Laterality: Right;   TOTAL HIP ARTHROPLASTY  08/15/2011   Procedure: TOTAL HIP ARTHROPLASTY ANTERIOR APPROACH;  Surgeon: Mauri Pole;  [facility];  Service: Orthopedics;  Laterality: Right;   TOTAL HIP ARTHROPLASTY Left 08/04/2014   Procedure: LEFT TOTAL HIP ARTHROPLASTY ANTERIOR APPROACH;  Surgeon: Mauri Pole, MD;  [facility];  Service: Orthopedics;  Laterality: Left;    Social History   Socioeconomic History   Marital status: Married    Spouse name: Not on file   Number of children: Not on file   Years of education: Not on file   Highest education level: Not on file  Occupational History   Not on file  Tobacco Use   Smoking status: Never   Smokeless tobacco: Never  Vaping Use   Vaping Use: Never used  Substance and Sexual Activity   Alcohol use: Yes    Comment: rarely   Drug use: No   Sexual activity: Yes  Other Topics Concern   Not on file  Social History Narrative   Not on file   Social Determinants of Health   Financial Resource Strain: Not on file  Food Insecurity: Not on file  Transportation Needs: Not on file  Physical Activity: Not on file  Stress: Not  on file  Social Connections: Not on file  Intimate Partner Violence: Not on file    Family History  Problem Relation Age of Onset   Stroke Father     Current Outpatient Medications  Medication Sig Dispense Refill   amLODipine (NORVASC) 10 MG tablet Take 10 mg by mouth daily.      amoxicillin-clavulanate (AUGMENTIN) 875-125 MG tablet Take 1 tablet by mouth 2 (two) times daily. 20 tablet 0   aspirin 81 MG EC tablet Take 81 mg by mouth at bedtime.      atorvastatin (LIPITOR) 10 MG tablet Take 10 mg by mouth daily.      carboxymethylcellulose 1 % ophthalmic solution Apply 1-2 drops to eye 3 (three) times daily as needed (DRY/IRRITATED EYES.).     cetirizine (ZYRTEC)  10 MG tablet Take 10 mg by mouth at bedtime.     Cholecalciferol (VITAMIN D) 2000 UNITS tablet Take 2,000 Units by mouth daily.     Cholecalciferol (VITAMIN D3) 50 MCG (2000 UT) TABS Take 2,000 Units by mouth.     diphenhydramine-acetaminophen (TYLENOL PM) 25-500 MG TABS tablet Take 1 tablet by mouth at bedtime.     fluticasone (FLONASE) 50 MCG/ACT nasal spray Place 1-2 sprays into both nostrils daily as needed for allergies or rhinitis.     HYDROcodone-acetaminophen (NORCO/VICODIN) 5-325 MG tablet Take 1-2 tablets by mouth every 4 (four) hours as needed for moderate pain. (Patient not taking: Reported on 09/13/2020) 30 tablet 0   losartan-hydrochlorothiazide (HYZAAR) 50-12.5 MG tablet Take 1 tablet by mouth 2 (two) times daily.     metFORMIN (GLUCOPHAGE) 1000 MG tablet Take 1,000 mg by mouth at bedtime.      montelukast (SINGULAIR) 10 MG tablet Take 10 mg by mouth daily.     Probiotic Product (PROBIOTIC ADVANCED PO) Take by mouth.     sitaGLIPtin-metformin (JANUMET) 50-500 MG tablet Take 2 tablets by mouth daily. SAMPLE     No current facility-administered medications for this visit.    Allergies  Allergen Reactions   Hydrocodone Itching   Oxycodone Itching   Shellfish Allergy Nausea And Vomiting   Sulfamethoxazole Nausea Only     REVIEW OF SYSTEMS:  [X]  denotes positive finding, [ ]  denotes negative finding Cardiac  Comments:  Chest pain or chest pressure:    Shortness of breath upon exertion:    Short of breath when lying flat:    Irregular heart rhythm:        Vascular    Pain in calf, thigh, or hip brought on by ambulation:    Pain in feet at night that wakes you up from your sleep:     Blood clot in your veins:    Leg swelling:         Pulmonary    Oxygen at home:    Productive cough:     Wheezing:         Neurologic    Sudden weakness in arms or legs:     Sudden numbness in arms or legs:     Sudden onset of difficulty speaking or slurred speech:    Temporary loss  of vision in one eye:     Problems with dizziness:         Gastrointestinal    Blood in stool:     Vomited blood:         Genitourinary    Burning when urinating:     Blood in urine:        Psychiatric  Major depression:         Hematologic    Bleeding problems:    Problems with blood clotting too easily:        Skin    Rashes or ulcers:        Constitutional    Fever or chills:      PHYSICAL EXAMINATION:  There were no vitals filed for this visit.   General:  WDWN in NAD; vital signs documented above Gait: Normal HENT: WNL, normocephalic Pulmonary: normal non-labored breathing, without wheezing Cardiac: regular HR, without  Murmurs without carotid bruit. Right CEA incision nicely healed Vascular Exam/Pulses:2+ radial pulses bilaterally, 2+ DP pulses bilaterally. Feet warm and well perfused Extremities: without ischemic changes, without Gangrene , without cellulitis; without open wounds;  Musculoskeletal: no muscle wasting or atrophy  Neurologic: A&O X 3;  No focal weakness or paresthesias are detected Psychiatric:  The pt has Normal affect.   Non-Invasive Vascular Imaging:    Summary:  Right Carotid: Velocities in the right ICA are consistent with a 1-39%  stenosis.   Left Carotid: Velocities in the left ICA are consistent with a 1-39%  stenosis.   Vertebrals:  Bilateral vertebral arteries demonstrate antegrade flow.  Subclavians: Normal flow hemodynamics were seen in bilateral subclavian               arteries.     ASSESSMENT/PLAN:: 71 y.o. female here for follow up for follow up of carotid stenosis. She is s/p right CEA on January 19th 2021 by Dr. Oneida Alar. She subsequently developed an infection of the patch and had to have the patch removed and replaced with a vein patch on February 5th 2021 by Dr. Oneida Alar.   Presents today in follow-up with mild internal carotid artery stenosis bilaterally.   There is been no significant progression, and Zoee has  no new symptoms.  I asked that she continue her current medication regimen as well as continue exercising on a daily basis.   She will follow up in 1 year with Carotid Duplex  Total time with Lannette Donath including pre-charting and interpreting imaging was greater than 30 minutes   Broadus John, MD [facility] [phone]

## 2021-09-16 ENCOUNTER — Other Ambulatory Visit: Payer: Self-pay

## 2021-09-16 ENCOUNTER — Ambulatory Visit: Payer: PPO | Admitting: Vascular Surgery

## 2021-09-16 ENCOUNTER — Ambulatory Visit (HOSPITAL_COMMUNITY)
Admission: RE | Admit: 2021-09-16 | Discharge: 2021-09-16 | Disposition: A | Discharge status: Home / Self Care | Payer: PPO | Source: Ambulatory Visit | Attending: Vascular Surgery | Admitting: Vascular Surgery

## 2021-09-16 ENCOUNTER — Encounter: Payer: Self-pay | Admitting: Vascular Surgery

## 2021-09-16 VITALS — BP 141/72 | HR 85 | Temp 97.9°F | Resp 20 | Ht 69.0 in | Wt 202.0 lb

## 2021-09-16 DIAGNOSIS — I6523 Occlusion and stenosis of bilateral carotid arteries: Secondary | ICD-10-CM

## 2021-12-07 DIAGNOSIS — E119 Type 2 diabetes mellitus without complications: Secondary | ICD-10-CM | POA: Diagnosis not present

## 2021-12-29 DIAGNOSIS — H6691 Otitis media, unspecified, right ear: Secondary | ICD-10-CM | POA: Diagnosis not present

## 2021-12-29 DIAGNOSIS — J309 Allergic rhinitis, unspecified: Secondary | ICD-10-CM | POA: Diagnosis not present

## 2021-12-29 DIAGNOSIS — Z6830 Body mass index (BMI) 30.0-30.9, adult: Secondary | ICD-10-CM | POA: Diagnosis not present

## 2022-02-03 DIAGNOSIS — R091 Pleurisy: Secondary | ICD-10-CM | POA: Diagnosis not present

## 2022-02-08 DIAGNOSIS — R0781 Pleurodynia: Secondary | ICD-10-CM | POA: Diagnosis not present

## 2022-02-08 DIAGNOSIS — Z9181 History of falling: Secondary | ICD-10-CM | POA: Diagnosis not present

## 2022-02-08 DIAGNOSIS — E785 Hyperlipidemia, unspecified: Secondary | ICD-10-CM | POA: Diagnosis not present

## 2022-02-08 DIAGNOSIS — I10 Essential (primary) hypertension: Secondary | ICD-10-CM | POA: Diagnosis not present

## 2022-02-08 DIAGNOSIS — Z6831 Body mass index (BMI) 31.0-31.9, adult: Secondary | ICD-10-CM | POA: Diagnosis not present

## 2022-02-08 DIAGNOSIS — E669 Obesity, unspecified: Secondary | ICD-10-CM | POA: Diagnosis not present

## 2022-02-08 DIAGNOSIS — E1169 Type 2 diabetes mellitus with other specified complication: Secondary | ICD-10-CM | POA: Diagnosis not present

## 2022-02-17 DIAGNOSIS — Z1231 Encounter for screening mammogram for malignant neoplasm of breast: Secondary | ICD-10-CM | POA: Diagnosis not present

## 2022-02-23 DIAGNOSIS — E119 Type 2 diabetes mellitus without complications: Secondary | ICD-10-CM | POA: Diagnosis not present

## 2022-03-13 DIAGNOSIS — Z96641 Presence of right artificial hip joint: Secondary | ICD-10-CM | POA: Diagnosis not present

## 2022-04-03 DIAGNOSIS — L821 Other seborrheic keratosis: Secondary | ICD-10-CM | POA: Diagnosis not present

## 2022-04-03 DIAGNOSIS — L570 Actinic keratosis: Secondary | ICD-10-CM | POA: Diagnosis not present

## 2022-04-03 DIAGNOSIS — L820 Inflamed seborrheic keratosis: Secondary | ICD-10-CM | POA: Diagnosis not present

## 2022-04-03 DIAGNOSIS — L578 Other skin changes due to chronic exposure to nonionizing radiation: Secondary | ICD-10-CM | POA: Diagnosis not present

## 2022-05-22 DIAGNOSIS — E1169 Type 2 diabetes mellitus with other specified complication: Secondary | ICD-10-CM | POA: Diagnosis not present

## 2022-05-22 DIAGNOSIS — Z0000 Encounter for general adult medical examination without abnormal findings: Secondary | ICD-10-CM | POA: Diagnosis not present

## 2022-05-22 DIAGNOSIS — E785 Hyperlipidemia, unspecified: Secondary | ICD-10-CM | POA: Diagnosis not present

## 2022-05-27 DIAGNOSIS — J309 Allergic rhinitis, unspecified: Secondary | ICD-10-CM | POA: Diagnosis not present

## 2022-06-06 DIAGNOSIS — E669 Obesity, unspecified: Secondary | ICD-10-CM | POA: Diagnosis not present

## 2022-06-06 DIAGNOSIS — E1169 Type 2 diabetes mellitus with other specified complication: Secondary | ICD-10-CM | POA: Diagnosis not present

## 2022-06-06 DIAGNOSIS — M8580 Other specified disorders of bone density and structure, unspecified site: Secondary | ICD-10-CM | POA: Diagnosis not present

## 2022-06-06 DIAGNOSIS — Z0000 Encounter for general adult medical examination without abnormal findings: Secondary | ICD-10-CM | POA: Diagnosis not present

## 2022-06-06 DIAGNOSIS — E782 Mixed hyperlipidemia: Secondary | ICD-10-CM | POA: Diagnosis not present

## 2022-06-06 DIAGNOSIS — I10 Essential (primary) hypertension: Secondary | ICD-10-CM | POA: Diagnosis not present

## 2022-06-06 DIAGNOSIS — J302 Other seasonal allergic rhinitis: Secondary | ICD-10-CM | POA: Diagnosis not present

## 2022-06-06 DIAGNOSIS — Z6831 Body mass index (BMI) 31.0-31.9, adult: Secondary | ICD-10-CM | POA: Diagnosis not present

## 2022-06-06 DIAGNOSIS — E785 Hyperlipidemia, unspecified: Secondary | ICD-10-CM | POA: Diagnosis not present

## 2022-09-06 ENCOUNTER — Other Ambulatory Visit: Payer: Self-pay | Admitting: *Deleted

## 2022-09-06 DIAGNOSIS — I6523 Occlusion and stenosis of bilateral carotid arteries: Secondary | ICD-10-CM

## 2022-09-22 ENCOUNTER — Ambulatory Visit: Payer: PPO | Admitting: Physician Assistant

## 2022-09-22 ENCOUNTER — Ambulatory Visit (HOSPITAL_COMMUNITY)
Admission: RE | Admit: 2022-09-22 | Discharge: 2022-09-22 | Disposition: A | Discharge status: Home / Self Care | Payer: PPO | Source: Ambulatory Visit | Attending: Surgery | Admitting: Surgery

## 2022-09-22 VITALS — BP 155/81 | HR 79 | Temp 97.5°F | Resp 20 | Ht 69.0 in | Wt 214.6 lb

## 2022-09-22 DIAGNOSIS — I6523 Occlusion and stenosis of bilateral carotid arteries: Secondary | ICD-10-CM

## 2022-09-22 NOTE — Progress Notes (Signed)
Office Note     CC:  follow up Requesting Provider:  Street, Sharon Mt, *  HPI: Kristin Byrd is a 72 y.o. (1951/03/22) female who presents for surveillance of carotid artery stenosis.  She underwent right sided carotid endarterectomy by Dr. Oneida Alar in January 2021.  She unfortunately developed infection of her dacryon patch.  In February 2021 Dr. Oneida Alar remove the Dacron patch and replaced it with right great saphenous vein patch.  She is followed annually for carotid surveillance.  She denies any neurological symptoms since last office visit including slurring speech, changes in vision, or one-sided weakness.  She continues to take her aspirin and statin daily.  She denies tobacco use.  She follows regularly with her PCP for management of chronic medical conditions including type 2 diabetes mellitus and hypertension.   Past Medical History:  Diagnosis Date   Arthritis    Diabetes mellitus    Hypertension    PONV (postoperative nausea and vomiting)     Past Surgical History:  Procedure Laterality Date   CHOLECYSTECTOMY     DENTAL SURGERY     DILATION AND CURETTAGE OF UTERUS     ENDARTERECTOMY Right 09/30/2019   Procedure: RIGHT ENDARTERECTOMY CAROTID;  Surgeon: Elam Dutch, MD;  Location: [facility];  Service: Vascular;  Laterality: Right;   ENDARTERECTOMY Right 10/17/2019   Procedure: Incision and Drainage Right Neck, Removal Dacron Patch from Right Carotid Artery, Vein Patch Angioplasty Right Carotid Artery; Harvest Right Greater Saphenous Vein Graft;  Surgeon: Elam Dutch, MD;  [facility];  Service: Vascular;  Laterality: Right;   EYE SURGERY     per pt lasik eye surgery bilateral on 09/26/19   L WIRST     CYST REMOVED   PATCH ANGIOPLASTY Right 09/30/2019   Procedure: PATCH ANGIOPLASTY [place];  Surgeon: Elam Dutch, MD;  Location: [place];  Service: Vascular;  Laterality: Right;   TOTAL HIP ARTHROPLASTY  08/15/2011    Procedure: TOTAL HIP ARTHROPLASTY ANTERIOR APPROACH;  Surgeon: Mauri Pole;  Location: [facility];  Service: Orthopedics;  Laterality: Right;   TOTAL HIP ARTHROPLASTY Left 08/04/2014   Procedure: LEFT TOTAL HIP ARTHROPLASTY ANTERIOR APPROACH;  Surgeon: Mauri Pole, MD;  Location: [facility];  Service: Orthopedics;  Laterality: Left;    Social History   Socioeconomic History   Marital status: Married    Spouse name: Not on file   Number of children: Not on file   Years of education: Not on file   Highest education level: Not on file  Occupational History   Not on file  Tobacco Use   Smoking status: Never    Passive exposure: Never   Smokeless tobacco: Never  Vaping Use   Vaping Use: Never used  Substance and Sexual Activity   Alcohol use: Yes    Comment: rarely   Drug use: No   Sexual activity: Yes  Other Topics Concern   Not on file  Social History Narrative   Not on file   Social Determinants of Health   Financial Resource Strain: Not on file  Food Insecurity: Not on file  Transportation Needs: Not on file  Physical Activity: Not on file  Stress: Not on file  Social Connections: Not on file  Intimate Partner Violence: Not on file    Family History  Problem Relation Age of Onset   Stroke Father     Current Outpatient Medications  Medication Sig Dispense Refill   amLODipine ([place])  10 MG tablet Take 10 mg by mouth daily.      aspirin 81 MG EC tablet Take 81 mg by mouth at bedtime.      carboxymethylcellulose 1 % ophthalmic solution Apply 1-2 drops to eye 3 (three) times daily as needed (DRY/IRRITATED EYES.).     cetirizine (ZYRTEC) 10 MG tablet Take 10 mg by mouth at bedtime.     Cholecalciferol (VITAMIN D3) 50 MCG (2000 UT) TABS Take 2,000 Units by mouth.     diphenhydramine-acetaminophen (TYLENOL PM) 25-500 MG TABS tablet Take 1 tablet by mouth at bedtime.     fluticasone (FLONASE) 50 MCG/ACT nasal spray Place 1-2 sprays into both nostrils daily as needed for  allergies or rhinitis.     JANUVIA 100 MG tablet Take 100 mg by mouth daily.     losartan-hydrochlorothiazide (HYZAAR) 50-12.5 MG tablet Take 1 tablet by mouth 2 (two) times daily.     metFORMIN (GLUCOPHAGE) 500 MG tablet Take 500 mg by mouth 2 (two) times daily.     montelukast (SINGULAIR) 10 MG tablet Take 10 mg by mouth daily.     Probiotic Product (PROBIOTIC ADVANCED PO) Take by mouth.     rosuvastatin (CRESTOR) 5 MG tablet Take 5 mg by mouth at bedtime.     No current facility-administered medications for this visit.    Allergies  Allergen Reactions   Hydrocodone Itching   Oxycodone Itching   Shellfish Allergy Nausea And Vomiting   Sulfamethoxazole Nausea Only     REVIEW OF SYSTEMS:   '[X]'$  denotes positive finding, '[ ]'$  denotes negative finding Cardiac  Comments:  Chest pain or chest pressure:    Shortness of breath upon exertion:    Short of breath when lying flat:    Irregular heart rhythm:        Vascular    Pain in calf, thigh, or hip brought on by ambulation:    Pain in feet at night that wakes you up from your sleep:     Blood clot in your veins:    Leg swelling:         Pulmonary    Oxygen at home:    Productive cough:     Wheezing:         Neurologic    Sudden weakness in arms or legs:     Sudden numbness in arms or legs:     Sudden onset of difficulty speaking or slurred speech:    Temporary loss of vision in one eye:     Problems with dizziness:         Gastrointestinal    Blood in stool:     Vomited blood:         Genitourinary    Burning when urinating:     Blood in urine:        Psychiatric    Major depression:         Hematologic    Bleeding problems:    Problems with blood clotting too easily:        Skin    Rashes or ulcers:        Constitutional    Fever or chills:      PHYSICAL EXAMINATION:  Vitals:   09/22/22 0951 09/22/22 0953  BP: (!) 156/83 (!) 155/81  Pulse: 79   Resp: 20   Temp: (!) 97.5 F (36.4 C)   TempSrc:  Temporal   SpO2: 97%   Weight: 214 lb 9.6 oz (97.3 kg)  Height: '5\' 9"'$  (1.753 m)     General:  WDWN in NAD; vital signs documented above Gait: Not observed HENT: WNL, normocephalic Pulmonary: normal non-labored breathing , without Rales, rhonchi,  wheezing Cardiac: regular HR Abdomen: soft, NT, no masses Skin: without rashes Vascular Exam/Pulses:  Right Left  Radial 2+ (normal) 2+ (normal)  DP 2+ (normal) 2+ (normal)   Extremities: without ischemic changes, without Gangrene , without cellulitis; without open wounds;  Musculoskeletal: no muscle wasting or atrophy  Neurologic: A&O X 3;  CN grossly intact Psychiatric:  The pt has Normal affect.   Non-Invasive Vascular Imaging:   B ICA 1-39%    ASSESSMENT/PLAN:: 72 y.o. female here for slurred balance of carotid artery stenosis   -Right ICA endarterectomy site without any hemodynamically significant stenosis -Left ICA 1 to 39% stenosis -Continue daily aspirin and statin -Follow regularly with PCP for management of chronic medical conditions including diabetes mellitus and hypertension -Repeat carotid duplex in 1 year   Dagoberto Ligas, PA-C [facility] [phone]  Clinic MD:   Trula Slade

## 2022-10-28 DIAGNOSIS — L820 Inflamed seborrheic keratosis: Secondary | ICD-10-CM | POA: Diagnosis not present

## 2022-11-07 ENCOUNTER — Telehealth: Payer: Self-pay

## 2022-11-07 NOTE — Patient Outreach (Signed)
  Care Coordination   11/07/2022 Name: Kristin Byrd MRN: [id number] DOB: Sep 21, 1950   Care Coordination Outreach Attempts:  An unsuccessful telephone outreach was attempted today to offer the patient information about available care coordination services as a benefit of their health plan.   Follow Up Plan:  Additional outreach attempts will be made to offer the patient care coordination information and services.   Encounter Outcome:  No Answer   Care Coordination Interventions:  No, not indicated    [name], RN, BSN, [facility] [employer] [phone]

## 2022-11-08 ENCOUNTER — Telehealth: Payer: Self-pay

## 2022-11-08 NOTE — Patient Outreach (Signed)
  Care Coordination   11/08/2022 Name: Kristin Byrd MRN: [id number] DOB: 1951-06-10   Care Coordination Outreach Attempts:  A second unsuccessful outreach was attempted today to offer the patient with information about available care coordination services as a benefit of their health plan.     Follow Up Plan:  Additional outreach attempts will be made to offer the patient care coordination information and services.   Encounter Outcome:  No Answer   Care Coordination Interventions:  No, not indicated    [name], RN, BSN, [facility] [employer] [phone]

## 2022-11-10 ENCOUNTER — Telehealth: Payer: Self-pay

## 2022-11-10 NOTE — Patient Outreach (Signed)
  Care Coordination   11/10/2022 Name: Kristin Byrd MRN: [id number] DOB: June 25, 1951   Care Coordination Outreach Attempts:  A third unsuccessful outreach was attempted today to offer the patient with information about available care coordination services as a benefit of their health plan.   Follow Up Plan:  No further outreach attempts will be made at this time. We have been unable to contact the patient to offer or enroll patient in care coordination services  Encounter Outcome:  No Answer   Care Coordination Interventions:  No, not indicated    [name], RN, BSN, CEN [place] Coordinator [phone]

## 2022-11-10 NOTE — Patient Outreach (Signed)
  Care Coordination   Initial Visit Note   11/10/2022 Name: Kristin Byrd MRN: [id number] DOB: [birth year]  Kristin Byrd is a 72 y.o. year old female who sees Street, Sharon Mt, MD for primary care. I spoke with  Despina Arias by phone today.  What matters to the patients health and wellness today?  Incoming call from husband.  Reviewed and explained [facility] program and he reports that his wife is doing well and denies any needs.    SDOH assessments and interventions completed:  No     Care Coordination Interventions:  No, not indicated   Follow up plan: No further intervention required.   Encounter Outcome:  Pt. Refused   [name], RN, BSN, CEN [facility] [employer] [phone]

## 2022-12-05 DIAGNOSIS — E785 Hyperlipidemia, unspecified: Secondary | ICD-10-CM | POA: Diagnosis not present

## 2022-12-05 DIAGNOSIS — E1169 Type 2 diabetes mellitus with other specified complication: Secondary | ICD-10-CM | POA: Diagnosis not present

## 2022-12-05 DIAGNOSIS — E782 Mixed hyperlipidemia: Secondary | ICD-10-CM | POA: Diagnosis not present

## 2022-12-22 DIAGNOSIS — J0100 Acute maxillary sinusitis, unspecified: Secondary | ICD-10-CM | POA: Diagnosis not present

## 2022-12-22 DIAGNOSIS — R051 Acute cough: Secondary | ICD-10-CM | POA: Diagnosis not present

## 2023-01-16 DIAGNOSIS — E1169 Type 2 diabetes mellitus with other specified complication: Secondary | ICD-10-CM | POA: Diagnosis not present

## 2023-01-16 DIAGNOSIS — Z9889 Other specified postprocedural states: Secondary | ICD-10-CM | POA: Diagnosis not present

## 2023-01-16 DIAGNOSIS — E669 Obesity, unspecified: Secondary | ICD-10-CM | POA: Diagnosis not present

## 2023-01-16 DIAGNOSIS — H60391 Other infective otitis externa, right ear: Secondary | ICD-10-CM | POA: Diagnosis not present

## 2023-01-16 DIAGNOSIS — Z6831 Body mass index (BMI) 31.0-31.9, adult: Secondary | ICD-10-CM | POA: Diagnosis not present

## 2023-01-16 DIAGNOSIS — E785 Hyperlipidemia, unspecified: Secondary | ICD-10-CM | POA: Diagnosis not present

## 2023-01-16 DIAGNOSIS — H7291 Unspecified perforation of tympanic membrane, right ear: Secondary | ICD-10-CM | POA: Diagnosis not present

## 2023-01-16 DIAGNOSIS — I10 Essential (primary) hypertension: Secondary | ICD-10-CM | POA: Diagnosis not present

## 2023-01-16 DIAGNOSIS — H9211 Otorrhea, right ear: Secondary | ICD-10-CM | POA: Diagnosis not present

## 2023-02-26 DIAGNOSIS — E119 Type 2 diabetes mellitus without complications: Secondary | ICD-10-CM | POA: Diagnosis not present

## 2023-03-06 DIAGNOSIS — L820 Inflamed seborrheic keratosis: Secondary | ICD-10-CM | POA: Diagnosis not present

## 2023-03-06 DIAGNOSIS — Z1231 Encounter for screening mammogram for malignant neoplasm of breast: Secondary | ICD-10-CM | POA: Diagnosis not present

## 2023-03-09 DIAGNOSIS — H6121 Impacted cerumen, right ear: Secondary | ICD-10-CM | POA: Diagnosis not present

## 2023-03-09 DIAGNOSIS — H6991 Unspecified Eustachian tube disorder, right ear: Secondary | ICD-10-CM | POA: Diagnosis not present

## 2023-03-14 DIAGNOSIS — H6991 Unspecified Eustachian tube disorder, right ear: Secondary | ICD-10-CM | POA: Diagnosis not present

## 2023-03-14 DIAGNOSIS — H6121 Impacted cerumen, right ear: Secondary | ICD-10-CM | POA: Diagnosis not present

## 2023-04-09 DIAGNOSIS — L820 Inflamed seborrheic keratosis: Secondary | ICD-10-CM | POA: Diagnosis not present

## 2023-04-09 DIAGNOSIS — L578 Other skin changes due to chronic exposure to nonionizing radiation: Secondary | ICD-10-CM | POA: Diagnosis not present

## 2023-04-09 DIAGNOSIS — L821 Other seborrheic keratosis: Secondary | ICD-10-CM | POA: Diagnosis not present

## 2023-06-01 DIAGNOSIS — E782 Mixed hyperlipidemia: Secondary | ICD-10-CM | POA: Diagnosis not present

## 2023-06-01 DIAGNOSIS — Z7984 Long term (current) use of oral hypoglycemic drugs: Secondary | ICD-10-CM | POA: Diagnosis not present

## 2023-06-07 DIAGNOSIS — R070 Pain in throat: Secondary | ICD-10-CM | POA: Diagnosis not present

## 2023-06-07 DIAGNOSIS — R0981 Nasal congestion: Secondary | ICD-10-CM | POA: Diagnosis not present

## 2023-06-07 DIAGNOSIS — R059 Cough, unspecified: Secondary | ICD-10-CM | POA: Diagnosis not present

## 2023-06-15 DIAGNOSIS — E782 Mixed hyperlipidemia: Secondary | ICD-10-CM | POA: Diagnosis not present

## 2023-06-15 DIAGNOSIS — E1169 Type 2 diabetes mellitus with other specified complication: Secondary | ICD-10-CM | POA: Diagnosis not present

## 2023-06-15 DIAGNOSIS — Z6832 Body mass index (BMI) 32.0-32.9, adult: Secondary | ICD-10-CM | POA: Diagnosis not present

## 2023-06-15 DIAGNOSIS — J0141 Acute recurrent pansinusitis: Secondary | ICD-10-CM | POA: Diagnosis not present

## 2023-06-15 DIAGNOSIS — J302 Other seasonal allergic rhinitis: Secondary | ICD-10-CM | POA: Diagnosis not present

## 2023-06-15 DIAGNOSIS — Z1339 Encounter for screening examination for other mental health and behavioral disorders: Secondary | ICD-10-CM | POA: Diagnosis not present

## 2023-06-15 DIAGNOSIS — Z9181 History of falling: Secondary | ICD-10-CM | POA: Diagnosis not present

## 2023-06-15 DIAGNOSIS — Z1331 Encounter for screening for depression: Secondary | ICD-10-CM | POA: Diagnosis not present

## 2023-06-15 DIAGNOSIS — Z0000 Encounter for general adult medical examination without abnormal findings: Secondary | ICD-10-CM | POA: Diagnosis not present

## 2023-06-15 DIAGNOSIS — I10 Essential (primary) hypertension: Secondary | ICD-10-CM | POA: Diagnosis not present

## 2023-06-15 DIAGNOSIS — E785 Hyperlipidemia, unspecified: Secondary | ICD-10-CM | POA: Diagnosis not present

## 2023-06-15 DIAGNOSIS — E669 Obesity, unspecified: Secondary | ICD-10-CM | POA: Diagnosis not present

## 2023-09-17 ENCOUNTER — Other Ambulatory Visit: Payer: Self-pay

## 2023-09-17 DIAGNOSIS — I6523 Occlusion and stenosis of bilateral carotid arteries: Secondary | ICD-10-CM

## 2023-09-27 ENCOUNTER — Ambulatory Visit (HOSPITAL_COMMUNITY)
Admission: RE | Admit: 2023-09-27 | Discharge: 2023-09-27 | Disposition: A | Discharge status: Home / Self Care | Payer: PPO | Source: Ambulatory Visit | Attending: Vascular Surgery | Admitting: Vascular Surgery

## 2023-09-27 ENCOUNTER — Ambulatory Visit: Payer: PPO | Admitting: Physician Assistant

## 2023-09-27 VITALS — BP 158/85 | HR 83 | Temp 98.2°F | Resp 20 | Ht 69.0 in | Wt 220.0 lb

## 2023-09-27 DIAGNOSIS — Z8619 Personal history of other infectious and parasitic diseases: Secondary | ICD-10-CM | POA: Diagnosis not present

## 2023-09-27 DIAGNOSIS — I6523 Occlusion and stenosis of bilateral carotid arteries: Secondary | ICD-10-CM | POA: Diagnosis not present

## 2023-09-27 DIAGNOSIS — Z9889 Other specified postprocedural states: Secondary | ICD-10-CM

## 2023-09-27 NOTE — Progress Notes (Signed)
Office Note     CC:  follow up Requesting Provider:  Street, Stephanie Coup, *  HPI: Kristin Byrd is a 73 y.o. (1951-08-09) female who presents for follow up of carotid artery stenosis.  She has history of right CEA by Dr. Darrick Penna in January of 2021. She unfortunately developed infection of her dacryon patch. In February 2021 Dr. Darrick Penna remove the Dacron patch and replaced it with right great saphenous vein patch. She otherwise did very well post operatively  She has been without any associated neurological symptoms. She denies any visual changes, slurred speech, facial drooping, unilateral upper or lower extremity weakness or numbness. She denies any headaches, dizziness or confusion. She denies any pain in her legs on ambulation or rest. No tissue loss. She is medically managed on Aspirin and statin.   Past Medical History:  Diagnosis Date   Arthritis    Carotid artery occlusion    Diabetes mellitus    Hypertension    PONV (postoperative nausea and vomiting)     Past Surgical History:  Procedure Laterality Date   CHOLECYSTECTOMY     DENTAL SURGERY     DILATION AND CURETTAGE OF UTERUS     ENDARTERECTOMY Right 09/30/2019   Procedure: RIGHT ENDARTERECTOMY CAROTID;  Surgeon: Sherren Kerns, MD;  Location: [facility] OR;  Service: Vascular;  Laterality: Right;   ENDARTERECTOMY Right 10/17/2019   Procedure: Incision and Drainage Right Neck, Removal Dacron Patch from Right Carotid Artery, Vein Patch Angioplasty Right Carotid Artery; Harvest Right Greater Saphenous Vein Graft;  Surgeon: Sherren Kerns, MD;  Location: [facility] OR;  Service: Vascular;  Laterality: Right;   EYE SURGERY     per pt lasik eye surgery bilateral on 09/26/19   L WIRST     CYST REMOVED   PATCH ANGIOPLASTY Right 09/30/2019   Procedure: PATCH ANGIOPLASTY USING HEMASHIELD PLATINUM FINESSE SHIELD;  Surgeon: Sherren Kerns, MD;  Location: [facility] OR;  Service: Vascular;  Laterality: Right;   TOTAL HIP ARTHROPLASTY  08/15/2011    Procedure: TOTAL HIP ARTHROPLASTY ANTERIOR APPROACH;  Surgeon: Shelda Pal;  Location: [facility];  Service: Orthopedics;  Laterality: Right;   TOTAL HIP ARTHROPLASTY Left 08/04/2014   Procedure: LEFT TOTAL HIP ARTHROPLASTY ANTERIOR APPROACH;  Surgeon: Shelda Pal, MD;  Location: [facility];  Service: Orthopedics;  Laterality: Left;    Social History   Socioeconomic History   Marital status: Married    Spouse name: Not on file   Number of children: Not on file   Years of education: Not on file   Highest education level: Not on file  Occupational History   Not on file  Tobacco Use   Smoking status: Never    Passive exposure: Never   Smokeless tobacco: Never  Vaping Use   Vaping status: Never Used  Substance and Sexual Activity   Alcohol use: Yes    Comment: rarely   Drug use: No   Sexual activity: Yes  Other Topics Concern   Not on file  Social History Narrative   Not on file   Social Drivers of Health   Financial Resource Strain: Not on file  Food Insecurity: Not on file  Transportation Needs: Not on file  Physical Activity: Not on file  Stress: Not on file  Social Connections: Not on file  Intimate Partner Violence: Not on file    Family History  Problem Relation Age of Onset   Stroke Father     Current Outpatient Medications  Medication Sig  Dispense Refill   amLODipine (NOR[place]ASC) 10 MG tablet Take 10 mg by mouth daily.      aspirin 81 MG EC tablet Take 81 mg by mouth at bedtime.      carboxymethylcellulose 1 % ophthalmic solution Apply 1-2 drops to eye 3 (three) times daily as needed (DRY/IRRITATED EYES.).     cetirizine (ZYRTEC) 10 MG tablet Take 10 mg by mouth at bedtime.     Cholecalciferol (VITAMIN D3) 50 MCG (2000 UT) TABS Take 2,000 Units by mouth.     diphenhydramine-acetaminophen (TYLENOL PM) 25-500 MG TABS tablet Take 1 tablet by mouth at bedtime.     fluticasone (FLONASE) 50 MCG/ACT nasal spray Place 1-2 sprays into both nostrils daily as needed for  allergies or rhinitis.     JANUVIA 100 MG tablet Take 100 mg by mouth daily.     losartan-hydrochlorothiazide (HYZAAR) 50-12.5 MG tablet Take 1 tablet by mouth 2 (two) times daily.     metFORMIN (GLUCOPHAGE) 500 MG tablet Take 500 mg by mouth 2 (two) times daily.     montelukast (SINGULAIR) 10 MG tablet Take 10 mg by mouth daily.     Probiotic Product (PROBIOTIC ADVANCED PO) Take by mouth.     rosuvastatin (CRESTOR) 5 MG tablet Take 5 mg by mouth at bedtime.     No current facility-administered medications for this visit.    Allergies  Allergen Reactions   Hydrocodone Itching   Oxycodone Itching   Shellfish Allergy Nausea And Vomiting   Sulfamethoxazole Nausea Only     REVIEW OF SYSTEMS:  [X]  denotes positive finding, [ ]  denotes negative finding Cardiac  Comments:  Chest pain or chest pressure:    Shortness of breath upon exertion:    Short of breath when lying flat:    Irregular heart rhythm:        Vascular    Pain in calf, thigh, or hip brought on by ambulation:    Pain in feet at night that wakes you up from your sleep:     Blood clot in your veins:    Leg swelling:         Pulmonary    Oxygen at home:    Productive cough:     Wheezing:         Neurologic    Sudden weakness in arms or legs:     Sudden numbness in arms or legs:     Sudden onset of difficulty speaking or slurred speech:    Temporary loss of vision in one eye:     Problems with dizziness:         Gastrointestinal    Blood in stool:     Vomited blood:         Genitourinary    Burning when urinating:     Blood in urine:        Psychiatric    Major depression:         Hematologic    Bleeding problems:    Problems with blood clotting too easily:        Skin    Rashes or ulcers:        Constitutional    Fever or chills:      PHYSICAL EXAMINATION:  Vitals:   09/27/23 1410 09/27/23 1412  BP: (!) 150/76 (!) 158/85  Pulse: 83   Resp: 20   Temp: 98.2 F (36.8 C)   TempSrc: Temporal    SpO2: 92%   Weight: 220 lb (99.8 kg)  Height: 5\' 9"  (1.753 m)     General:  WDWN in NAD; vital signs documented above Gait: Normal HENT: WNL, normocephalic Pulmonary: normal non-labored breathing , without wheezing Cardiac: regular HR Abdomen: soft Vascular Exam/Pulses: 2+ radial, 2+ femoral and DP pulses bilaterally Extremities: without ischemic changes, without Gangrene , without cellulitis; without open wounds;  Musculoskeletal: no muscle wasting or atrophy  Neurologic: A&O X 3 Psychiatric:  The pt has Normal affect.   Non-Invasive Vascular Imaging:   VAS Carotid Duplex: Summary:  Right Carotid: Velocities in the right ICA are consistent with a 1-39% stenosis.   Left Carotid: Velocities in the left ICA are consistent with a 1-39% stenosis.   Vertebrals: Left vertebral artery demonstrates antegrade flow. Right vertebral artery demonstrates an occlusion.  Subclavians: Normal flow hemodynamics were seen in bilateral subclavian arteries.    ASSESSMENT/PLAN:: 73 y.o. female here for follow up of carotid artery stenosis.  She has history of right CEA by Dr. Darrick Penna in January of 2021. She unfortunately developed infection of her dacryon patch. In February 2021 Dr. Darrick Penna remove the Dacron patch and replaced it with right great saphenous vein patch. She otherwise did very well post operatively. She has no new neurological symptoms.  - Duplex shows 1-39% ICA stenosis bilaterally. The left vertebral has normal flow, the right vertebral appears occluded today. This is new from prior duplex. Normal flow in both subclavian arteries.  - She does not have any vertebrobasilar symptoms so no indication for intervention.  - Continue Aspirin and statin -She will follow up in 1 year with repeat carotid duplex   Graceann Congress, PA-C [facility] [phone]  Clinic MD:   Karin Lieu

## 2023-10-23 ENCOUNTER — Other Ambulatory Visit: Payer: Self-pay

## 2023-10-23 DIAGNOSIS — I6523 Occlusion and stenosis of bilateral carotid arteries: Secondary | ICD-10-CM

## 2023-11-08 DIAGNOSIS — E559 Vitamin D deficiency, unspecified: Secondary | ICD-10-CM | POA: Diagnosis not present

## 2023-11-08 DIAGNOSIS — E782 Mixed hyperlipidemia: Secondary | ICD-10-CM | POA: Diagnosis not present

## 2023-11-08 DIAGNOSIS — E785 Hyperlipidemia, unspecified: Secondary | ICD-10-CM | POA: Diagnosis not present

## 2023-11-08 DIAGNOSIS — E1169 Type 2 diabetes mellitus with other specified complication: Secondary | ICD-10-CM | POA: Diagnosis not present

## 2023-11-08 DIAGNOSIS — Z79899 Other long term (current) drug therapy: Secondary | ICD-10-CM | POA: Diagnosis not present

## 2024-01-03 DIAGNOSIS — L209 Atopic dermatitis, unspecified: Secondary | ICD-10-CM | POA: Diagnosis not present

## 2024-03-03 DIAGNOSIS — E119 Type 2 diabetes mellitus without complications: Secondary | ICD-10-CM | POA: Diagnosis not present

## 2024-03-25 DIAGNOSIS — Z1231 Encounter for screening mammogram for malignant neoplasm of breast: Secondary | ICD-10-CM | POA: Diagnosis not present

## 2024-04-08 DIAGNOSIS — L821 Other seborrheic keratosis: Secondary | ICD-10-CM | POA: Diagnosis not present

## 2024-04-08 DIAGNOSIS — L820 Inflamed seborrheic keratosis: Secondary | ICD-10-CM | POA: Diagnosis not present

## 2024-04-08 DIAGNOSIS — L578 Other skin changes due to chronic exposure to nonionizing radiation: Secondary | ICD-10-CM | POA: Diagnosis not present

## 2024-06-09 DIAGNOSIS — E785 Hyperlipidemia, unspecified: Secondary | ICD-10-CM | POA: Diagnosis not present

## 2024-06-09 DIAGNOSIS — E559 Vitamin D deficiency, unspecified: Secondary | ICD-10-CM | POA: Diagnosis not present

## 2024-06-09 DIAGNOSIS — E782 Mixed hyperlipidemia: Secondary | ICD-10-CM | POA: Diagnosis not present

## 2024-06-09 DIAGNOSIS — E1169 Type 2 diabetes mellitus with other specified complication: Secondary | ICD-10-CM | POA: Diagnosis not present

## 2024-06-13 DIAGNOSIS — H6121 Impacted cerumen, right ear: Secondary | ICD-10-CM | POA: Diagnosis not present

## 2024-06-13 DIAGNOSIS — H9201 Otalgia, right ear: Secondary | ICD-10-CM | POA: Diagnosis not present

## 2024-06-17 DIAGNOSIS — Z0000 Encounter for general adult medical examination without abnormal findings: Secondary | ICD-10-CM | POA: Diagnosis not present

## 2024-06-17 DIAGNOSIS — I10 Essential (primary) hypertension: Secondary | ICD-10-CM | POA: Diagnosis not present

## 2024-06-17 DIAGNOSIS — J302 Other seasonal allergic rhinitis: Secondary | ICD-10-CM | POA: Diagnosis not present

## 2024-06-17 DIAGNOSIS — Z6831 Body mass index (BMI) 31.0-31.9, adult: Secondary | ICD-10-CM | POA: Diagnosis not present

## 2024-06-17 DIAGNOSIS — E1169 Type 2 diabetes mellitus with other specified complication: Secondary | ICD-10-CM | POA: Diagnosis not present

## 2024-06-17 DIAGNOSIS — E785 Hyperlipidemia, unspecified: Secondary | ICD-10-CM | POA: Diagnosis not present

## 2024-06-17 DIAGNOSIS — E669 Obesity, unspecified: Secondary | ICD-10-CM | POA: Diagnosis not present

## 2024-06-17 DIAGNOSIS — E782 Mixed hyperlipidemia: Secondary | ICD-10-CM | POA: Diagnosis not present

## 2024-06-17 DIAGNOSIS — Z9181 History of falling: Secondary | ICD-10-CM | POA: Diagnosis not present

## 2024-06-17 DIAGNOSIS — Z1339 Encounter for screening examination for other mental health and behavioral disorders: Secondary | ICD-10-CM | POA: Diagnosis not present

## 2024-06-17 DIAGNOSIS — Z1331 Encounter for screening for depression: Secondary | ICD-10-CM | POA: Diagnosis not present

## 2024-06-17 DIAGNOSIS — M8580 Other specified disorders of bone density and structure, unspecified site: Secondary | ICD-10-CM | POA: Diagnosis not present

## 2024-06-18 DIAGNOSIS — M8580 Other specified disorders of bone density and structure, unspecified site: Secondary | ICD-10-CM | POA: Diagnosis not present

## 2024-07-16 DIAGNOSIS — Z6831 Body mass index (BMI) 31.0-31.9, adult: Secondary | ICD-10-CM | POA: Diagnosis not present

## 2024-07-16 DIAGNOSIS — J40 Bronchitis, not specified as acute or chronic: Secondary | ICD-10-CM | POA: Diagnosis not present

## 2024-07-16 DIAGNOSIS — J329 Chronic sinusitis, unspecified: Secondary | ICD-10-CM | POA: Diagnosis not present

## 2024-07-24 DIAGNOSIS — R051 Acute cough: Secondary | ICD-10-CM | POA: Diagnosis not present

## 2024-07-24 DIAGNOSIS — Z6831 Body mass index (BMI) 31.0-31.9, adult: Secondary | ICD-10-CM | POA: Diagnosis not present

## 2024-08-06 DIAGNOSIS — M810 Age-related osteoporosis without current pathological fracture: Secondary | ICD-10-CM | POA: Diagnosis not present

## 2024-09-09 ENCOUNTER — Other Ambulatory Visit: Payer: Self-pay

## 2024-09-09 DIAGNOSIS — I6523 Occlusion and stenosis of bilateral carotid arteries: Secondary | ICD-10-CM

## 2024-10-09 ENCOUNTER — Ambulatory Visit: Admitting: Physician Assistant

## 2024-10-09 ENCOUNTER — Ambulatory Visit (HOSPITAL_COMMUNITY)
Admission: RE | Admit: 2024-10-09 | Discharge: 2024-10-09 | Disposition: A | Discharge status: Home / Self Care | Source: Ambulatory Visit | Attending: Vascular Surgery | Admitting: Vascular Surgery

## 2024-10-09 VITALS — BP 154/84 | HR 76 | Temp 98.0°F

## 2024-10-09 DIAGNOSIS — I6523 Occlusion and stenosis of bilateral carotid arteries: Secondary | ICD-10-CM | POA: Insufficient documentation

## 2024-10-09 NOTE — Progress Notes (Signed)
 "   Office Note   History of Present Illness   Kristin Byrd is a 74 y.o. (09-29-1950) female who presents for carotid artery stenosis surveillance.  She has a history of right CEA by Dr. Harvey in January 2021.  She unfortunately developed infection of her dacryon patch and underwent removal of the dacryon patch with placement of a right greater saphenous vein patch in February 2021.  She has had no issues since.  She returns today for follow-up.  She is doing well at today's office visit.  She denies any strokelike symptoms such as sudden visual changes, slurred speech, facial drooping, sudden unilateral weakness or numbness.  She is taking a daily aspirin  and statin.  Current Outpatient Medications  Medication Sig Dispense Refill   amLODipine  (NORVASC ) 10 MG tablet Take 10 mg by mouth daily.      aspirin  81 MG EC tablet Take 81 mg by mouth at bedtime.      carboxymethylcellulose 1 % ophthalmic solution Apply 1-2 drops to eye 3 (three) times daily as needed (DRY/IRRITATED EYES.).     cetirizine (ZYRTEC) 10 MG tablet Take 10 mg by mouth at bedtime.     Cholecalciferol  (VITAMIN D3) 50 MCG (2000 UT) TABS Take 2,000 Units by mouth.     diphenhydramine -acetaminophen  (TYLENOL  PM) 25-500 MG TABS tablet Take 1 tablet by mouth at bedtime.     fluticasone  (FLONASE ) 50 MCG/ACT nasal spray Place 1-2 sprays into both nostrils daily as needed for allergies or rhinitis.     JANUVIA  100 MG tablet Take 100 mg by mouth daily.     losartan -hydrochlorothiazide  (HYZAAR) 50-12.5 MG tablet Take 1 tablet by mouth 2 (two) times daily.     metFORMIN  (GLUCOPHAGE ) 500 MG tablet Take 500 mg by mouth 2 (two) times daily.     montelukast  (SINGULAIR ) 10 MG tablet Take 10 mg by mouth daily.     Probiotic Product (PROBIOTIC ADVANCED PO) Take by mouth.     rosuvastatin (CRESTOR) 5 MG tablet Take 5 mg by mouth at bedtime.     No current facility-administered medications for this visit.    REVIEW OF SYSTEMS  (negative unless checked):   Cardiac:  []  Chest pain or chest pressure? []  Shortness of breath upon activity? []  Shortness of breath when lying flat? []  Irregular heart rhythm?  Vascular:  []  Pain in calf, thigh, or hip brought on by walking? []  Pain in feet at night that wakes you up from your sleep? []  Blood clot in your veins? []  Leg swelling?  Pulmonary:  []  Oxygen at home? []  Productive cough? []  Wheezing?  Neurologic:  []  Sudden weakness in arms or legs? []  Sudden numbness in arms or legs? []  Sudden onset of difficult speaking or slurred speech? []  Temporary loss of vision in one eye? []  Problems with dizziness?  Gastrointestinal:  []  Blood in stool? []  Vomited blood?  Genitourinary:  []  Burning when urinating? []  Blood in urine?  Psychiatric:  []  Major depression  Hematologic:  []  Bleeding problems? []  Problems with blood clotting?  Dermatologic:  []  Rashes or ulcers?  Constitutional:  []  Fever or chills?  Ear/Nose/Throat:  []  Change in hearing? []  Nose bleeds? []  Sore throat?  Musculoskeletal:  []  Back pain? []  Joint pain? []  Muscle pain?   Physical Examination   Vitals:   10/09/24 0914 10/09/24 0916  BP: (!) 151/80 (!) 154/84  Pulse: 76   Temp: 98 F (36.7 C)   SpO2: 96%    There is  no height or weight on file to calculate BMI.  General:  WDWN in NAD; vital signs documented above Gait: Not observed HENT: WNL, normocephalic Pulmonary: normal non-labored breathing  Cardiac: regular Abdomen: soft, NT, no masses Skin: without rashes Vascular Exam/Pulses: palpable radial pulses bilaterally Extremities: without ischemic changes, without gangrene , without cellulitis; without open wounds;  Musculoskeletal: no muscle wasting or atrophy  Neurologic: A&O X 3;  No focal weakness or paresthesias are detected Psychiatric:  The pt has normal affect  Non-Invasive Vascular Imaging   Bilateral Carotid Duplex (10/09/2024):  R ICA stenosis:   1-39% R VA:  patent and antegrade L ICA stenosis:  1-39% L VA:  patent and antegrade   Medical Decision Making   LALA BEEN is a 74 y.o. female who presents for surveillance of carotid artery stenosis  Based on the patient's vascular studies, her carotid artery stenosis is stable bilaterally at 1 to 39% She denies any strokelike symptoms such as slurred speech, facial droop, sudden visual changes, or sudden weakness/numbness. She has no neurological deficits on exam.  She has palpable and equal radial pulses bilaterally She can continue her daily aspirin  and statin and follow-up with our office in 1 year with repeat carotid duplex   Ahmed Holster PA-C [facility] [place] Office: [phone]  Clinic MD: [place]  "
